# Patient Record
Sex: Female | Born: 1945 | Race: Black or African American | Hispanic: No | Marital: Married | Smoking: Never smoker
Health system: Southern US, Community
[De-identification: ages and names within clinical notes are randomized; demographics above are authoritative.]

## PROBLEM LIST (undated history)

## (undated) DIAGNOSIS — C50919 Malignant neoplasm of unspecified site of unspecified female breast: Secondary | ICD-10-CM

## (undated) DIAGNOSIS — I10 Essential (primary) hypertension: Secondary | ICD-10-CM

## (undated) DIAGNOSIS — C801 Malignant (primary) neoplasm, unspecified: Secondary | ICD-10-CM

## (undated) DIAGNOSIS — Z972 Presence of dental prosthetic device (complete) (partial): Secondary | ICD-10-CM

## (undated) DIAGNOSIS — E785 Hyperlipidemia, unspecified: Secondary | ICD-10-CM

## (undated) DIAGNOSIS — R011 Cardiac murmur, unspecified: Secondary | ICD-10-CM

## (undated) DIAGNOSIS — E039 Hypothyroidism, unspecified: Secondary | ICD-10-CM

## (undated) HISTORY — PX: COLONOSCOPY: SHX174

## (undated) HISTORY — PX: EYE SURGERY: SHX253

---

## 2004-10-16 ENCOUNTER — Ambulatory Visit: Payer: Self-pay | Admitting: Internal Medicine

## 2009-08-01 ENCOUNTER — Emergency Department: Payer: Self-pay | Admitting: Emergency Medicine

## 2009-09-21 ENCOUNTER — Ambulatory Visit: Payer: Self-pay | Admitting: Internal Medicine

## 2010-01-17 ENCOUNTER — Ambulatory Visit: Payer: Self-pay | Admitting: Internal Medicine

## 2011-02-20 ENCOUNTER — Ambulatory Visit: Payer: Self-pay | Admitting: Internal Medicine

## 2011-03-07 ENCOUNTER — Ambulatory Visit: Payer: Self-pay | Admitting: Internal Medicine

## 2011-12-31 DIAGNOSIS — Z923 Personal history of irradiation: Secondary | ICD-10-CM

## 2011-12-31 DIAGNOSIS — C50919 Malignant neoplasm of unspecified site of unspecified female breast: Secondary | ICD-10-CM

## 2011-12-31 HISTORY — DX: Malignant neoplasm of unspecified site of unspecified female breast: C50.919

## 2011-12-31 HISTORY — DX: Personal history of irradiation: Z92.3

## 2011-12-31 HISTORY — PX: BREAST EXCISIONAL BIOPSY: SUR124

## 2012-03-17 ENCOUNTER — Ambulatory Visit: Payer: Self-pay | Admitting: Internal Medicine

## 2012-03-20 ENCOUNTER — Ambulatory Visit: Payer: Self-pay | Admitting: Internal Medicine

## 2012-04-20 ENCOUNTER — Ambulatory Visit: Payer: Self-pay | Admitting: General Surgery

## 2012-04-20 HISTORY — PX: BREAST SURGERY: SHX581

## 2012-04-20 HISTORY — PX: BREAST LUMPECTOMY: SHX2

## 2012-04-23 ENCOUNTER — Ambulatory Visit: Payer: Self-pay | Admitting: General Surgery

## 2012-04-23 LAB — COMPREHENSIVE METABOLIC PANEL
Albumin: 3.5 g/dL (ref 3.4–5.0)
Anion Gap: 7 (ref 7–16)
Bilirubin,Total: 0.5 mg/dL (ref 0.2–1.0)
Calcium, Total: 8.6 mg/dL (ref 8.5–10.1)
Chloride: 109 mmol/L — ABNORMAL HIGH (ref 98–107)
Co2: 26 mmol/L (ref 21–32)
EGFR (African American): >60
EGFR (Non-African Amer.): >60
Glucose: 85 mg/dL (ref 65–99)
Osmolality: 282 (ref 275–301)
Potassium: 4.2 mmol/L (ref 3.5–5.1)
SGPT (ALT): 24 U/L
Total Protein: 6.8 g/dL (ref 6.4–8.2)

## 2012-04-23 LAB — CBC WITH DIFFERENTIAL/PLATELET
Eosinophil #: 0.1 10*3/uL (ref 0.0–0.7)
HGB: 13.4 g/dL (ref 12.0–16.0)
MCV: 91 fL (ref 80–100)
Monocyte #: 0.4 x10 3/mm (ref 0.2–0.9)
Neutrophil #: 3.6 10*3/uL (ref 1.4–6.5)
Neutrophil %: 59.8 %
Platelet: 179 10*3/uL (ref 150–440)
RBC: 4.37 10*6/uL (ref 3.80–5.20)
RDW: 13.8 % (ref 11.5–14.5)
WBC: 6 10*3/uL (ref 3.6–11.0)

## 2012-04-24 ENCOUNTER — Other Ambulatory Visit: Payer: Self-pay | Admitting: General Surgery

## 2012-04-24 DIAGNOSIS — C50919 Malignant neoplasm of unspecified site of unspecified female breast: Secondary | ICD-10-CM

## 2012-04-24 LAB — CEA: CEA: 0.9 ng/mL (ref 0.0–4.7)

## 2012-05-02 ENCOUNTER — Ambulatory Visit
Admission: RE | Admit: 2012-05-02 | Discharge: 2012-05-02 | Disposition: A | Discharge status: Home / Self Care | Payer: Managed Care, Other (non HMO) | Source: Ambulatory Visit | Attending: General Surgery | Admitting: General Surgery

## 2012-05-02 DIAGNOSIS — C50919 Malignant neoplasm of unspecified site of unspecified female breast: Secondary | ICD-10-CM

## 2012-05-02 MED ORDER — GADOBENATE DIMEGLUMINE 529 MG/ML IV SOLN
18.0000 mL | Freq: Once | INTRAVENOUS | Status: AC | PRN
  Administered 2012-05-02: 18 mL via INTRAVENOUS

## 2012-05-26 ENCOUNTER — Ambulatory Visit: Payer: Self-pay | Admitting: General Surgery

## 2012-06-09 ENCOUNTER — Ambulatory Visit: Payer: Self-pay | Admitting: Oncology

## 2012-06-29 ENCOUNTER — Ambulatory Visit: Payer: Self-pay | Admitting: Oncology

## 2012-07-09 LAB — CBC CANCER CENTER
Basophil #: 0 x10 3/mm (ref 0.0–0.1)
Basophil %: 0.6 %
Eosinophil #: 0.1 x10 3/mm (ref 0.0–0.7)
HGB: 13.7 g/dL (ref 12.0–16.0)
Lymphocyte %: 26 %
MCHC: 32.3 g/dL (ref 32.0–36.0)
Monocyte #: 0.3 x10 3/mm (ref 0.2–0.9)
Neutrophil %: 66 %
Platelet: 202 x10 3/mm (ref 150–440)
RBC: 4.6 10*6/uL (ref 3.80–5.20)

## 2012-07-16 LAB — CBC CANCER CENTER
Basophil #: 0 x10 3/mm (ref 0.0–0.1)
Basophil %: 0.6 %
Eosinophil #: 0.1 x10 3/mm (ref 0.0–0.7)
HCT: 40.7 % (ref 35.0–47.0)
HGB: 13.2 g/dL (ref 12.0–16.0)
Lymphocyte #: 1.2 x10 3/mm (ref 1.0–3.6)
MCH: 29.9 pg (ref 26.0–34.0)
MCHC: 32.5 g/dL (ref 32.0–36.0)
MCV: 92 fL (ref 80–100)
Monocyte #: 0.3 x10 3/mm (ref 0.2–0.9)
Neutrophil #: 3.6 x10 3/mm (ref 1.4–6.5)

## 2012-07-23 LAB — CBC CANCER CENTER
Basophil #: 0 x10 3/mm (ref 0.0–0.1)
Eosinophil %: 2.9 %
HCT: 40.8 % (ref 35.0–47.0)
Lymphocyte #: 1 x10 3/mm (ref 1.0–3.6)
MCH: 31 pg (ref 26.0–34.0)
MCV: 92 fL (ref 80–100)
Monocyte #: 0.4 x10 3/mm (ref 0.2–0.9)
Neutrophil #: 3.6 x10 3/mm (ref 1.4–6.5)
Platelet: 204 x10 3/mm (ref 150–440)
RDW: 13.5 % (ref 11.5–14.5)
WBC: 5.2 x10 3/mm (ref 3.6–11.0)

## 2012-07-30 ENCOUNTER — Ambulatory Visit: Payer: Self-pay | Admitting: Oncology

## 2012-07-30 LAB — CBC CANCER CENTER
Basophil %: 0.5 %
Eosinophil #: 0.1 x10 3/mm (ref 0.0–0.7)
Eosinophil %: 2.5 %
Lymphocyte #: 1 x10 3/mm (ref 1.0–3.6)
MCH: 30.9 pg (ref 26.0–34.0)
MCHC: 33.6 g/dL (ref 32.0–36.0)
MCV: 92 fL (ref 80–100)
Monocyte #: 0.5 x10 3/mm (ref 0.2–0.9)
Neutrophil %: 70.6 %
RBC: 4.53 10*6/uL (ref 3.80–5.20)

## 2012-08-06 LAB — CBC CANCER CENTER
Basophil #: 0 x10 3/mm (ref 0.0–0.1)
Eosinophil %: 2.9 %
HCT: 40.5 % (ref 35.0–47.0)
HGB: 13.7 g/dL (ref 12.0–16.0)
Lymphocyte #: 0.8 x10 3/mm — ABNORMAL LOW (ref 1.0–3.6)
MCH: 30.9 pg (ref 26.0–34.0)
MCV: 91 fL (ref 80–100)
Monocyte %: 7.8 %
Neutrophil #: 3.4 x10 3/mm (ref 1.4–6.5)
RBC: 4.43 10*6/uL (ref 3.80–5.20)
RDW: 13.8 % (ref 11.5–14.5)

## 2012-08-13 LAB — CBC CANCER CENTER
Basophil #: 0 x10 3/mm (ref 0.0–0.1)
Eosinophil #: 0.1 x10 3/mm (ref 0.0–0.7)
Eosinophil %: 2.5 %
Lymphocyte #: 0.7 x10 3/mm — ABNORMAL LOW (ref 1.0–3.6)
Lymphocyte %: 13.9 %
MCV: 91 fL (ref 80–100)
Monocyte %: 7.4 %
Neutrophil %: 75.7 %
Platelet: 170 x10 3/mm (ref 150–440)
RDW: 13.7 % (ref 11.5–14.5)
WBC: 5.2 x10 3/mm (ref 3.6–11.0)

## 2012-08-20 LAB — CBC CANCER CENTER
Basophil #: 0 x10 3/mm (ref 0.0–0.1)
Basophil %: 0.5 %
Eosinophil #: 0.1 x10 3/mm (ref 0.0–0.7)
HCT: 40.9 % (ref 35.0–47.0)
Lymphocyte #: 0.6 x10 3/mm — ABNORMAL LOW (ref 1.0–3.6)
Lymphocyte %: 13.3 %
MCH: 30.2 pg (ref 26.0–34.0)
MCV: 91 fL (ref 80–100)
Monocyte #: 0.4 x10 3/mm (ref 0.2–0.9)
Monocyte %: 8.1 %
RBC: 4.51 10*6/uL (ref 3.80–5.20)
WBC: 4.9 x10 3/mm (ref 3.6–11.0)

## 2012-08-30 ENCOUNTER — Ambulatory Visit: Payer: Self-pay | Admitting: Oncology

## 2012-09-29 ENCOUNTER — Ambulatory Visit: Payer: Self-pay | Admitting: Oncology

## 2012-12-08 ENCOUNTER — Ambulatory Visit: Payer: Self-pay | Admitting: Oncology

## 2012-12-30 ENCOUNTER — Ambulatory Visit: Payer: Self-pay | Admitting: Oncology

## 2013-01-30 ENCOUNTER — Ambulatory Visit: Payer: Self-pay | Admitting: Oncology

## 2013-02-27 ENCOUNTER — Ambulatory Visit: Payer: Self-pay | Admitting: Oncology

## 2013-03-30 ENCOUNTER — Ambulatory Visit: Payer: Self-pay | Admitting: Oncology

## 2013-05-30 ENCOUNTER — Ambulatory Visit: Payer: Self-pay | Admitting: Oncology

## 2013-06-29 ENCOUNTER — Ambulatory Visit: Payer: Self-pay | Admitting: Oncology

## 2013-07-30 ENCOUNTER — Ambulatory Visit: Payer: Self-pay | Admitting: Oncology

## 2013-08-30 ENCOUNTER — Ambulatory Visit: Payer: Self-pay | Admitting: Oncology

## 2013-09-29 ENCOUNTER — Ambulatory Visit: Payer: Self-pay | Admitting: Oncology

## 2014-01-04 ENCOUNTER — Ambulatory Visit: Payer: Self-pay | Admitting: Oncology

## 2014-01-30 ENCOUNTER — Ambulatory Visit: Payer: Self-pay | Admitting: Oncology

## 2014-03-22 ENCOUNTER — Ambulatory Visit: Payer: Self-pay | Admitting: Oncology

## 2014-05-05 ENCOUNTER — Ambulatory Visit: Payer: Self-pay | Admitting: Oncology

## 2014-05-30 ENCOUNTER — Ambulatory Visit: Payer: Self-pay | Admitting: Oncology

## 2014-08-16 ENCOUNTER — Ambulatory Visit: Payer: Self-pay | Admitting: Oncology

## 2014-08-30 ENCOUNTER — Ambulatory Visit: Payer: Self-pay | Admitting: Oncology

## 2014-11-17 ENCOUNTER — Ambulatory Visit: Payer: Self-pay | Admitting: Oncology

## 2014-11-29 ENCOUNTER — Ambulatory Visit: Payer: Self-pay | Admitting: Oncology

## 2015-03-24 ENCOUNTER — Ambulatory Visit: Payer: Self-pay | Admitting: Oncology

## 2015-04-23 NOTE — Op Note (Signed)
PATIENT NAME:  Cindy [name], Cindy [name] MR#:  [id number] DATE OF BIRTH:  June 13, 1946  DATE OF PROCEDURE:  04/20/2012  PREOPERATIVE DIAGNOSIS: Papillary neoplasm, right breast.   POSTOPERATIVE DIAGNOSIS: Papillary neoplasm, right breast.   OPERATIVE PROCEDURE: Wide local excision.   SURGEON: Robert Bellow, MD  ANESTHESIA: General by LMA, 0.5% Xylocaine with 0.25% Marcaine with NaH2CO3 30 mL local infiltration.   ESTIMATED BLOOD LOSS: Less than 10 mL.   CLINICAL NOTE: This 69 year old woman was found to have a complex cystic lesion in the 12:00 position of the right breast. Vacuum-assisted biopsy showed evidence of a papillary neoplasm, excision was planned to help establish if there was a malignant potential.   OPERATIVE NOTE: The patient underwent general anesthesia and tolerated this well. The breast was prepped with ChloraPrep and draped. Ultrasound was used to confirm the area of the old biopsy. This was infiltrated with the above-mentioned local anesthetic for postoperative analgesia. A skin line and curvilinear incision in the upper aspect of the breast approximately 6 cm from the nipple was carried down through the skin and subcutaneous tissue. Beginning about 1.5 cm below the skin the biopsy cavity was created about 4 cm in diameter and depth. The specimen was orientated and specimen radiograph confirmed the previously placed clip was in the medial aspect of the specimen. Hemostasis was with electrocautery. The deep tissue was approximated with interrupted 2-0 Vicryl figure-of-eight sutures. The skin was closed with running 4-0 Vicryl subcuticular suture. Benzoin, Steri-Strips followed by Telfa and Tegaderm dressing was applied. The patient's bra was then placed to provide adequate support.        The patient tolerated the procedure well and was taken to recovery room in stable condition.   ____________________________ Robert Bellow, MD jwb:cms D: 04/20/2012 20:51:39  ET T: 04/21/2012 09:39:58 ET JOB#: [id number]  cc: Robert Bellow, MD, <Dictator> Perrin Maltese, MD Gildo Crisco Amedeo Kinsman MD ELECTRONICALLY SIGNED 04/21/2012 14:59

## 2015-04-23 NOTE — Consult Note (Signed)
Reason for Visit: This 69 year old Female patient presents to the clinic for initial evaluation of  Breast cancer .   Referred by Dr. Hervey Ard.  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 Fine-needle aspiration was performed showing atypical papillary neoplasm. She went on to have a wide local excision showing a 1.1 cm invasive pleomorphic lobular carcinoma. Ductal carcinoma was present. Tumor was overall nuclear at a rate of 2. Angiolymphatic invasion was not identified. Margins were uninvolved although close at less than 0.1 mm. DCIS margin was also close at 2 mm. Tumor was strongly ER positive PR negative not overexpressed by HER-2/neu. Because of this was a lobular carcinoma breast MRI was ordered showed no evidence of synchronous lesions.she underwent reexcision which was negative for any evidence of invasive or in situ carcinoma. No sentinel lymph node was identified at the time of resection. She is done well postoperatively. She is seen today for consideration of medical oncology and radiation oncology opinion.  Past Hx:    Breast Cancer: Right, 2013   Anemia:    Right breast mass:    Gallstones:    Hypercholesterolemia:    Excision of right breast mass: Apr 2013   Colonoscopy: 2004  Past, Family and Social History:   Past Medical History positive    Cardiovascular hyperlipidemia; hypertension    Gastrointestinal gall stones    Past Medical History Comments Anemia    Family  History positive    Family History Comments Family history positive for prostate cancer adult onset diabetes and hypertension as well as breast cancer.    Social History noncontributory    Additional Past Medical and Surgical History Seen accompanied by nurse navigator today   Allergies:   No Known Allergies:   Home Meds:  Home Medications: Medication Instructions Status  Crestor 20 mg oral tablet 1 tab(s) orally once a day (in the morning) Active  Claritin 10 mg oral tablet 1 tab(s) orally once a day, As Needed Active   Review of Systems:   General negative    Performance Status (ECOG) 0    Skin negative    Breast see HPI    Ophthalmologic negative    ENMT negative    Respiratory and Thorax negative    Cardiovascular negative    Gastrointestinal negative    Genitourinary negative    Musculoskeletal negative    Neurological negative    Psychiatric negative    Hematology/Lymphatics negative    Endocrine negative    Allergic/Immunologic negative   Nursing Notes:  Nursing Vital Signs and Chemo Nursing Nursing Notes: *CC Vital Signs Flowsheet:   11-Jun-13 10:02   Temp Temperature 96.7   Respirations Respirations 16   SBP SBP 124   DBP DBP 79   Pain Scale (0-10)  0   Current Weight (kg) (kg) 86.6    10:44   Temp Temperature 96.7   Pulse Pulse 73   Respirations Respirations 16   SBP SBP 124   DBP DBP 79   Pain Scale (  0-10)  0   Current Weight (kg) (kg) 86.6   Height (cm) centimeters 190.9   BSA (m2) 2.1   Physical Exam:  General/Skin/HEENT:   General normal    Skin normal    Eyes normal    ENMT normal    Head and Neck normal    Additional PE Well-developed slightly obese female in NAD. Right breast is a wide local excision scar which is healed well. She also is an axillary incision which is also well-healed. No dominant mass or nodularity is noted in either breast into position examined. Lungs are clear to A&P cardiac examination shows  regular rate and rhythm. Abdomen is benign. No axillary or supraclavicular adenopathy is identified.   Breasts/Resp/CV/GI/GU:   Respiratory and Thorax normal    Cardiovascular normal    Gastrointestinal normal    Genitourinary normal   MS/Neuro/Psych/Lymph:   Musculoskeletal normal    Neurological normal    Lymphatics normal   Assessment and Plan:  Impression:   pathologic stage I invasive lobular carcinoma the right breast has post wide local excision and attempted sentinel node lymph node biopsy in 69 year old female  Plan:   this time I discussed the case with Dr. Grayland Ormond. He will be ordering Oncotype DX testing to determine need for chemotherapy. I have recommended whole breast radiation up to 5000 cGy. Also boost or scar another 1400 cGy. 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 Risks and benefits of treatment were reviewed with the patient including skin reaction, fatigue and incorporation of small portion of superficial lung were all explained in detail to the patient. I have given her a CT simulation appointment for about 2 weeks and hopefully the Oncotype DX result will be back by that time. Patient also be consulted by Dr. Grayland Ormond.I did discuss the case with Dr. Hervey Ard and based on our discussion she is not a candidate for accelerated partial breast irradiation.  I would like to take this opportunity to thank you for allowing me to continue to participate in this patient's care.  CC Referral:   cc: Dr. Lamonte Sakai, Dr. Hervey Ard   Electronic Signatures: Armstead Peaks (MD)  (Signed 11-Jun-13 11:28)  Authored: HPI, Diagnosis, Past Hx, PFSH, Allergies, Home Meds, ROS, Nursing Notes, Physical Exam, Encounter Assessment and  Plan, CC Referring Physician   Last Updated: 11-Jun-13 11:28 by Armstead Peaks (MD)

## 2015-04-23 NOTE — Op Note (Signed)
PATIENT NAME:  Cindy [name], Cindy [name] MR#:  [id number] DATE OF BIRTH:  Dec 12, 1946  DATE OF PROCEDURE:  05/26/2012  PREOPERATIVE DIAGNOSIS: Infiltrating lobular carcinoma, right breast.   POSTOPERATIVE DIAGNOSIS: Infiltrating lobular carcinoma, right breast.  OPERATIVE PROCEDURE: Wide local excision, right breast, attempted axillary sentinel node biopsy.   SURGEON: Hervey Ard, MD   ANESTHESIA: General by LMA under Dr. Ronelle Nigh.   ANESTHESIOLOGIST: Dennard Nip, MD   ESTIMATED BLOOD LOSS: 25 mL.   CLINICAL NOTE: This 69 year old woman underwent a stereotactic biopsy with findings of atypical papilloma. She subsequently underwent an ultrasound-guided wide excision of this area with findings of a small infiltrating lobular carcinoma. Margins were close but clear. Preoperative breast MRI showed no secondary lesions or evidence of axillary adenopathy. She was felt to be a candidate for re-excision of the site and sentinel node biopsy. She was injected with technetium sulfur colloid prior to the procedure.   OPERATIVE NOTE: With the patient under adequate general anesthesia, 4 mL of methylene blue diluted 1:2 with normal saline was injected in the subareolar plexus. The breast and axilla was then prepped with ChloraPrep and draped. Attention was turned to the axilla. Exceptionally low counts were obtained with the gamma finder (less than 12). A small incision was made at the inferior aspect of the axillary envelope and extended to the area at the latissimus muscle. The skin was incised sharply and hemostasis achieved with electrocautery. A total of 20 mL of 0.5% Marcaine with 1:200,000 units of epinephrine was used for both this excision and the wide excision site during the procedure.   Hemostasis was with electrocautery. The incision was extended down to the pectoralis and serratus fascia. Palpation showed no discernible lesion. Scanning with the gamma finder and ultrasound showed no discernible  lesions. No lymphatics were identified with methylene blue. Considering the negative breast MRI, it was elected to not complete an axillary dissection. The wound was closed in layers with 2-0 Vicryl figure-of-eight sutures for the deep tissue and a running 4-0 Vicryl subcuticular suture for the skin.   Attention was turned to the breast. Ultrasound was used to confirm the old biopsy cavity. The pathologist had reported the medial and superior margins to be close. The old incision was opened and the cavity identified. An additional 1.5 cm of tissue medial and superior to the original site was excised. The specimen was orientated with four Marcaine clips and sent in formalin for histology. The breast tissue was elevated off the underlying pectoralis muscle and approximated with interrupted 2-0 Vicryl figure-of-eight sutures. The more superficial layers were treated in a similar fashion. The skin was closed with a running 4-0 Vicryl subcuticular suture. Benzoin and Steri-Strips were applied. A compressive wrap followed by a fluff, Kerlix and an Ace wrap was then applied.   The patient tolerated the procedure well and was taken to the recovery room in stable condition.  ____________________________ Robert Bellow, MD jwb:cbb D: 05/26/2012 14:26:00 ET T: 05/26/2012 15:50:30 ET JOB#: [id number]  cc: Robert Bellow, MD, <Dictator> Perrin Maltese, MD JEFFREY Amedeo Kinsman MD ELECTRONICALLY SIGNED 05/26/2012 17:34

## 2015-05-18 ENCOUNTER — Inpatient Hospital Stay: Payer: Medicare Other | Attending: Oncology | Admitting: Oncology

## 2015-06-27 ENCOUNTER — Other Ambulatory Visit: Payer: Self-pay | Admitting: Internal Medicine

## 2015-06-27 DIAGNOSIS — Z853 Personal history of malignant neoplasm of breast: Secondary | ICD-10-CM

## 2015-06-28 ENCOUNTER — Other Ambulatory Visit (HOSPITAL_COMMUNITY): Payer: Self-pay | Admitting: Internal Medicine

## 2015-06-28 ENCOUNTER — Other Ambulatory Visit: Payer: Self-pay | Admitting: Internal Medicine

## 2015-06-28 DIAGNOSIS — M25562 Pain in left knee: Secondary | ICD-10-CM

## 2015-06-30 ENCOUNTER — Ambulatory Visit: Payer: Managed Care, Other (non HMO)

## 2015-07-06 ENCOUNTER — Encounter
Admission: RE | Admit: 2015-07-06 | Discharge: 2015-07-06 | Disposition: A | Discharge status: Home / Self Care | Payer: Medicare Other | Source: Ambulatory Visit | Attending: Internal Medicine | Admitting: Internal Medicine

## 2015-07-06 DIAGNOSIS — M546 Pain in thoracic spine: Secondary | ICD-10-CM | POA: Diagnosis present

## 2015-07-06 DIAGNOSIS — Z853 Personal history of malignant neoplasm of breast: Secondary | ICD-10-CM | POA: Diagnosis not present

## 2015-07-06 DIAGNOSIS — M25562 Pain in left knee: Secondary | ICD-10-CM | POA: Insufficient documentation

## 2015-07-06 HISTORY — DX: Malignant (primary) neoplasm, unspecified: C80.1

## 2015-07-06 HISTORY — DX: Essential (primary) hypertension: I10

## 2015-07-06 MED ORDER — TECHNETIUM TC 99M MEDRONATE IV KIT
22.2750 | PACK | Freq: Once | INTRAVENOUS | Status: AC | PRN
Start: 2015-07-06 — End: 2015-07-06
  Administered 2015-07-06: 22.275 via INTRAVENOUS

## 2015-07-20 ENCOUNTER — Inpatient Hospital Stay: Payer: Medicare Other | Attending: Oncology | Admitting: Oncology

## 2015-07-20 VITALS — BP 133/73 | HR 85 | Temp 95.9°F | Resp 18 | Wt 200.2 lb

## 2015-07-20 DIAGNOSIS — Z780 Asymptomatic menopausal state: Secondary | ICD-10-CM

## 2015-07-20 DIAGNOSIS — Z79811 Long term (current) use of aromatase inhibitors: Secondary | ICD-10-CM | POA: Diagnosis not present

## 2015-07-20 DIAGNOSIS — Z170 Estrogen receptor positive status [ER+]: Secondary | ICD-10-CM | POA: Insufficient documentation

## 2015-07-20 DIAGNOSIS — C50911 Malignant neoplasm of unspecified site of right female breast: Secondary | ICD-10-CM

## 2015-07-20 DIAGNOSIS — C50919 Malignant neoplasm of unspecified site of unspecified female breast: Secondary | ICD-10-CM

## 2015-07-25 ENCOUNTER — Ambulatory Visit: Payer: Medicare Other

## 2015-07-27 ENCOUNTER — Ambulatory Visit
Admission: RE | Admit: 2015-07-27 | Discharge: 2015-07-27 | Disposition: A | Discharge status: Home / Self Care | Payer: Medicare Other | Source: Ambulatory Visit | Attending: Oncology | Admitting: Oncology

## 2015-07-27 DIAGNOSIS — Z780 Asymptomatic menopausal state: Secondary | ICD-10-CM | POA: Insufficient documentation

## 2015-07-30 DIAGNOSIS — C50411 Malignant neoplasm of upper-outer quadrant of right female breast: Secondary | ICD-10-CM | POA: Insufficient documentation

## 2015-07-30 NOTE — Progress Notes (Signed)
[facility]  [phone] [phone]  ID: Cindy [name] OB: 05/22/1946  MR#: [id number]  EYC#:[id number]  Patient Care Team: Perrin Maltese, MD as PCP - General (Internal Medicine)  CHIEF COMPLAINT:  Chief Complaint  Patient presents with  . Follow-up    breast cancer    INTERVAL HISTORY: Patient returns to clinic today for further evaluation and routine 6 month followup.  She is tolerating letrozole well without significant side effects.  She currently feels well and is asymptomatic.  She has no recent fevers or illnesses.  She has a good appetite and denies weight loss.  She has no neurologic complaints.  She denies any other pain.  She has no nausea, vomiting, constipation, or diarrhea.  She has no urinary complaints.  Patient offers no specific complaints today.  REVIEW OF SYSTEMS:   Review of Systems  Constitutional: Negative.   Musculoskeletal: Negative.   Neurological: Negative.     As per HPI. Otherwise, a complete review of systems is negatve.  PAST MEDICAL HISTORY: Past Medical History  Diagnosis Date  . Hypertension   . Cancer     breast    PAST SURGICAL HISTORY: Past Surgical History  Procedure Laterality Date  . Breast surgery Right 2013    lumpectomy    FAMILY HISTORY No family history on file.     ADVANCED DIRECTIVES:    HEALTH MAINTENANCE: History  Substance Use Topics  . Smoking status: Not on file  . Smokeless tobacco: Not on file  . Alcohol Use: Not on file     Colonoscopy:  PAP:  Bone density:  Lipid panel:  No Known Allergies  Current Outpatient Prescriptions  Medication Sig Dispense Refill  . amLODipine (NORVASC) 5 MG tablet daily.  6  . atorvastatin (LIPITOR) 20 MG tablet     . hydrochlorothiazide (MICROZIDE) 12.5 MG capsule Take 12.5 mg by mouth 2 (two) times daily.  6  . letrozole (FEMARA) 2.5 MG tablet Take 2.5 mg by mouth daily.  5   No current facility-administered medications for  this visit.    OBJECTIVE: Filed Vitals:   07/20/15 1040  BP: 133/73  Pulse: 85  Temp: 95.9 F (35.5 C)  Resp: 18     There is no height on file to calculate BMI.    ECOG FS:0 - Asymptomatic  General: Well-developed, well-nourished, no acute distress. Eyes: Pink conjunctiva, anicteric sclera. Breasts: Bilateral breast and axilla without lumps or masses. Lungs: Clear to auscultation bilaterally. Heart: Regular rate and rhythm. No rubs, murmurs, or gallops. Abdomen: Soft, nontender, nondistended. No organomegaly noted, normoactive bowel sounds. Musculoskeletal: No edema, cyanosis, or clubbing. Neuro: Alert, answering all questions appropriately. Cranial nerves grossly intact. Skin: No rashes or petechiae noted. Psych: Normal affect.  LAB RESULTS:  Lab Results  Component Value Date   NA 142 04/23/2012   K 4.2 04/23/2012   CL 109* 04/23/2012   CO2 26 04/23/2012   GLUCOSE 85 04/23/2012   BUN 11 04/23/2012   CREATININE 0.78 04/23/2012   CALCIUM 8.6 04/23/2012   PROT 6.8 04/23/2012   ALBUMIN 3.5 04/23/2012   AST 21 04/23/2012   ALT 24 04/23/2012   ALKPHOS 88 04/23/2012   BILITOT 0.5 04/23/2012   GFRNONAA >60 04/23/2012   GFRAA >60 04/23/2012    Lab Results  Component Value Date   WBC 4.9 08/20/2012   NEUTROABS 3.7 08/20/2012   HGB 13.6 08/20/2012   HCT 40.9 08/20/2012   MCV 91 08/20/2012   PLT  175 08/20/2012     STUDIES: Nm Bone Scan Whole Body  07/06/2015   CLINICAL DATA:  Breast carcinoma.  Back and left knee pain.  EXAM: NUCLEAR MEDICINE WHOLE BODY BONE SCAN  TECHNIQUE: Whole body anterior and posterior images were obtained approximately 3 hours after intravenous injection of radiopharmaceutical.  RADIOPHARMACEUTICALS:  22.3 mCi Technetium-74m MDP IV  COMPARISON:  None.  FINDINGS: Degenerative uptake is seen in the medial compartment of the left knee. Degenerative uptake also seen in both feet and AC joints.  No other abnormal sites of radiopharmaceutical uptake  are seen within the axial or appendicular skeleton.  IMPRESSION: No evidence of osseous metastatic disease.  Degenerative uptake in medial compartment of left knee, bilateral feet, and bilateral AC joints.   Electronically Signed   By: Earle Gell M.D.   On: 07/06/2015 12:49   Dg Bone Density  07/27/2015   EXAM: DUAL X-RAY ABSORPTIOMETRY (DXA) FOR BONE MINERAL DENSITY  IMPRESSION: Dear Dr. Grayland Ormond, Your patient Cindy [name] completed a BMD test on 07/27/2015 using the [facility] (analysis version: 14.10) manufactured by [employer]. The following summarizes the results of our evaluation. PATIENT BIOGRAPHICAL: Name: Cindy, [name] Patient ID: [id number] Birth Date: 27-Jun-1946 Height: 63.0 in. Gender: Female Exam Date: 07/27/2015 Weight: 201.0 lbs. Indications: Breast CA, High Risk Meds, History of Breast Cancer, Postmenopausal Fractures: Treatments: letrozole, lipitor  ASSESSMENT: The BMD measured at Femur Neck Left is 0.902 g/cm2 with a T-score of -1.0. This patient is considered normal according to [facility]) criteria. Site Region Measured Measured WHO Young Adult BMD Date       Age      Classification T-score AP Spine L1-L4 07/27/2015 66.7 Normal 0.2 1.219 g/cm2 AP Spine L1-L4 01/18/2014 65.2 Normal 0.6 1.263 g/cm2 AP Spine L1-L4 09/08/2012 63.9 Normal 0.9 1.301 g/cm2  DualFemur Neck Left 07/27/2015 66.7 Normal -1.0 0.902 g/cm2 DualFemur Neck Left 01/18/2014 65.2 Normal -0.6 0.961 g/cm2 DualFemur Neck Left 09/08/2012 63.9 Normal 0.0 1.033 g/cm2  World Health Organization [facility]) criteria for post-menopausal, Caucasian Women: Normal:       T-score at or above -1 SD Osteopenia:   T-score between -1 and -2.5 SD Osteoporosis: T-score at or below -2.5 SD  RECOMMENDATIONS: [place] recommends that FDA-approved medical therapies be considered in postmenopausal women and men age 44 or older with a: 1. Hip or vertebral (clinical or morphometric) fracture. 2.  T-score of < -2.5 at the spine or hip. 3. Ten-year fracture probability by FRAX of 3% or greater for hip fracture or 20% or greater for major osteoporotic fracture.  All treatment decisions require clinical judgment and consideration of individual patient factors, including patient preferences, co-morbidities, previous drug use, risk factors not captured in the FRAX model (e.g. falls, vitamin D deficiency, increased bone turnover, interval significant decline in bone density) and possible under - or over-estimation of fracture risk by FRAX.  All patients should ensure an adequate intake of dietary calcium (1200 mg/d) and vitamin D (800 IU daily) unless contraindicated. FOLLOW-UP: People with diagnosed cases of osteoporosis or at high risk for fracture should have regular bone mineral density tests. For patients eligible for Medicare, routine testing is allowed once every 2 years. The testing frequency can be increased to one year for patients who have rapidly progressing disease, those who are receiving or discontinuing medical therapy to restore bone mass, or have additional risk factors.  I have reviewed this report, and agree with the above findings.  [facility]   Electronically Signed   By: Julian Hy M.D.   On: 07/27/2015 14:49    ASSESSMENT: Stage Ia ER positive breast cancer, Oncotype DX score 25 which is intermediate cancer recurrence.  PLAN:    1.  Breast cancer: Previously, Oncotype DX was 25 which is considered intermediate risk and has an approximate 16% chance of distant recurrence. After a lengthy discussion with the patient, she declined adjuvant chemotherapy and wished to proceed with adjuvant XRT followed by an aromatase inhibitor for 5 years. Continue letrozle completing in September 2018.  Patient's most recent mammogram on March 24, 2015 was reported as BI-RADS 2. Repeat in one year. Return to clinic in 6 months for routine evaluation. 2. Postmenopausal: Bone mineral  density completed on July 27, 2015 had a reported T score of -1.0. This is considered normal. Repeat in July 2018.     Patient expressed understanding and was in agreement with this plan. She also understands that She can call clinic at any time with any questions, concerns, or complaints.   Breast cancer   Staging form: Breast, AJCC 7th Edition     Clinical stage from 07/30/2015: Stage IA (T1c, N0, M0) - Signed by Lloyd Huger, MD on 07/30/2015   Lloyd Huger, MD   07/30/2015 12:52 PM

## 2015-08-17 ENCOUNTER — Ambulatory Visit
Admission: RE | Admit: 2015-08-17 | Discharge: 2015-08-17 | Disposition: A | Discharge status: Home / Self Care | Payer: Medicare Other | Source: Ambulatory Visit | Attending: Radiation Oncology | Admitting: Radiation Oncology

## 2015-08-17 ENCOUNTER — Encounter: Payer: Self-pay | Admitting: Radiation Oncology

## 2015-08-17 VITALS — BP 138/84 | HR 87 | Temp 97.1°F | Resp 20 | Ht 63.0 in | Wt 200.0 lb

## 2015-08-17 DIAGNOSIS — C50911 Malignant neoplasm of unspecified site of right female breast: Secondary | ICD-10-CM

## 2015-08-17 NOTE — Progress Notes (Signed)
Radiation Oncology Follow up Note  Name: Cindy [name]   Date:   08/17/2015 MRN:  [id number] DOB: [birth year]    This 69 y.o. female presents to the clinic today for follow-up for stage I breast cancer lobular carcinoma of the right breast status post whole breast radiation ER positive PR negative HER-2/neu not overexpressed.Marland Kitchen  REFERRING PROVIDER: Perrin Maltese, MD  HPI: Patient is a 69 year old female now out 3 years having completed whole breast radiation to her right breast for stage I infiltrating lobular carcinoma. Tumor was ER/PR positive HER-2/neu not overexpressed. She's currently on letrozole tolerating that well. She's been having some hot flashes prescription for Effexor was made although she's not yet filled that. She specifically denies breast tenderness cough or bone pain..  COMPLICATIONS OF TREATMENT: none  FOLLOW UP COMPLIANCE: keeps appointments   PHYSICAL EXAM:  BP 138/84 mmHg  Pulse 87  Temp(Src) 97.1 F (36.2 C)  Resp 20  Ht 5' 3" (1.6 m)  Wt 199 lb 15.3 oz (90.7 kg)  BMI 35.43 kg/m2 Lungs are clear to A&P cardiac examination essentially unremarkable with regular rate and rhythm. No dominant mass or nodularity is noted in either breast in 2 positions examined. Incision is well-healed. No axillary or supraclavicular adenopathy is appreciated. Cosmetic result is excellent. Well-developed well-nourished patient in NAD. HEENT reveals PERLA, EOMI, discs not visualized.  Oral cavity is clear. No oral mucosal lesions are identified. Neck is clear without evidence of cervical or supraclavicular adenopathy. Lungs are clear to A&P. Cardiac examination is essentially unremarkable with regular rate and rhythm without murmur rub or thrill. Abdomen is benign with no organomegaly or masses noted. Motor sensory and DTR levels are equal and symmetric in the upper and lower extremities. Cranial nerves II through XII are grossly intact. Proprioception is intact. No peripheral adenopathy  or edema is identified. No motor or sensory levels are noted. Crude visual fields are within normal range.   RADIOLOGY RESULTS: Mammograms are reviewed  PLAN: At the present time she continues to do well with no evidence of disease. I have asked to see her back in 1 year for follow-up. She continues on letrozole. We made sure she gets her prescription for Effexor for her hot flashes. Patient knows to call with any concerns. She continues close follow-up care with surgeon who is been ordering her mammograms as well as medical oncology.  I would like to take this opportunity for allowing me to participate in the care of your patient.Armstead Peaks., MD

## 2015-08-31 ENCOUNTER — Telehealth: Payer: Self-pay | Admitting: *Deleted

## 2015-08-31 MED ORDER — LETROZOLE 2.5 MG PO TABS: 2.5000 mg | ORAL_TABLET | Freq: Every day | ORAL | Status: DC

## 2015-08-31 NOTE — Telephone Encounter (Signed)
Escribed

## 2016-01-22 ENCOUNTER — Ambulatory Visit: Payer: Medicare Other | Admitting: Oncology

## 2016-01-23 ENCOUNTER — Inpatient Hospital Stay: Payer: Medicare Other | Attending: Oncology | Admitting: Oncology

## 2016-01-23 VITALS — BP 127/84 | HR 87 | Temp 97.2°F | Resp 18 | Wt 199.7 lb

## 2016-01-23 DIAGNOSIS — Z79899 Other long term (current) drug therapy: Secondary | ICD-10-CM | POA: Diagnosis not present

## 2016-01-23 DIAGNOSIS — Z170 Estrogen receptor positive status [ER+]: Secondary | ICD-10-CM | POA: Diagnosis not present

## 2016-01-23 DIAGNOSIS — M2550 Pain in unspecified joint: Secondary | ICD-10-CM | POA: Insufficient documentation

## 2016-01-23 DIAGNOSIS — Z780 Asymptomatic menopausal state: Secondary | ICD-10-CM | POA: Diagnosis not present

## 2016-01-23 DIAGNOSIS — I10 Essential (primary) hypertension: Secondary | ICD-10-CM | POA: Insufficient documentation

## 2016-01-23 DIAGNOSIS — C50911 Malignant neoplasm of unspecified site of right female breast: Secondary | ICD-10-CM | POA: Diagnosis not present

## 2016-01-23 DIAGNOSIS — Z79811 Long term (current) use of aromatase inhibitors: Secondary | ICD-10-CM | POA: Insufficient documentation

## 2016-01-23 NOTE — Progress Notes (Signed)
Patient has worsening body aches for the past month.

## 2016-01-23 NOTE — Progress Notes (Signed)
[place]  Telephone:(336) [phone] Fax:(336) [phone]  ID: Cindy [name] [name] OB: December 02, 1946  MR#: [id number]  [id number]  Patient Care Team: Cindy [name] Maltese, MD as PCP - General (Internal Medicine)  CHIEF COMPLAINT:  Chief Complaint  Patient presents with  . Breast Cancer    INTERVAL HISTORY: Patient returns to clinic today for further evaluation and routine 6 month followup.  She is tolerating letrozole well without significant side effects.  She currently feels well and is asymptomatic.  She does complain of joint pain that she has had in the past but has worsened in the past 6 weeks. She has no recent fevers or illnesses.  She has a good appetite and denies weight loss.  She has no neurologic complaints. She has no nausea, vomiting, constipation, or diarrhea.  She has no urinary complaints.  Patient offers no specific complaints today.  REVIEW OF SYSTEMS:   Review of Systems  Constitutional: Negative.   Cardiovascular: Negative.   Gastrointestinal: Negative.   Musculoskeletal: Positive for joint pain.  Neurological: Negative.     As per HPI. Otherwise, a complete review of systems is negatve.  PAST MEDICAL HISTORY: Past Medical History  Diagnosis Date  . Hypertension   . Cancer     breast    PAST SURGICAL HISTORY: Past Surgical History  Procedure Laterality Date  . Breast surgery Right 2013    lumpectomy    FAMILY HISTORY No family history on file.     ADVANCED DIRECTIVES:    HEALTH MAINTENANCE: Social History  Substance Use Topics  . Smoking status: Never Smoker   . Smokeless tobacco: Never Used  . Alcohol Use: Not on file     Colonoscopy:  PAP:  Bone density:  Lipid panel:  No Known Allergies  Current Outpatient Prescriptions  Medication Sig Dispense Refill  . amLODipine (NORVASC) 5 MG tablet daily.  6  . atorvastatin (LIPITOR) 20 MG tablet     . Coenzyme Q10 (CO Q 10 PO) Take 1 tablet by mouth daily.    .  hydrochlorothiazide (MICROZIDE) 12.5 MG capsule Take 12.5 mg by mouth 2 (two) times daily.  6  . letrozole (FEMARA) 2.5 MG tablet Take 1 tablet (2.5 mg total) by mouth daily. 90 tablet 1   No current facility-administered medications for this visit.    OBJECTIVE: Filed Vitals:   01/23/16 1032  BP: 127/84  Pulse: 87  Temp: 97.2 F (36.2 C)  Resp: 18     Body mass index is 35.39 kg/(m^2).    ECOG FS:0 - Asymptomatic  General: Well-developed, well-nourished, no acute distress. Eyes: Pink conjunctiva, anicteric sclera. Breasts: Bilateral breast and axilla without lumps or masses. Lungs: Clear to auscultation bilaterally. Heart: Regular rate and rhythm. No rubs, murmurs, or gallops. Abdomen: Soft, nontender, nondistended. No organomegaly noted, normoactive bowel sounds. Musculoskeletal: No edema, cyanosis, or clubbing. Neuro: Alert, answering all questions appropriately. Cranial nerves grossly intact. Skin: No rashes or petechiae noted. Psych: Normal affect.  LAB RESULTS:  Lab Results  Component Value Date   NA 142 04/23/2012   K 4.2 04/23/2012   CL 109* 04/23/2012   CO2 26 04/23/2012   GLUCOSE 85 04/23/2012   BUN 11 04/23/2012   CREATININE 0.78 04/23/2012   CALCIUM 8.6 04/23/2012   PROT 6.8 04/23/2012   ALBUMIN 3.5 04/23/2012   AST 21 04/23/2012   ALT 24 04/23/2012   ALKPHOS 88 04/23/2012   BILITOT 0.5 04/23/2012   GFRNONAA >60 04/23/2012   GFRAA >60 04/23/2012  Lab Results  Component Value Date   WBC 4.9 08/20/2012   NEUTROABS 3.7 08/20/2012   HGB 13.6 08/20/2012   HCT 40.9 08/20/2012   MCV 91 08/20/2012   PLT 175 08/20/2012     STUDIES: No results found.  ASSESSMENT: Stage Ia ER positive breast cancer, Oncotype DX score 25 which is intermediate cancer recurrence.  PLAN:    1.  Breast cancer: Previously, Oncotype DX was 25 which is considered intermediate risk and has an approximate 16% chance of distant recurrence. After a lengthy discussion with the  patient, she declined adjuvant chemotherapy and wished to proceed with adjuvant XRT followed by an aromatase inhibitor for 5 years. Continue letrozle completing in September 2018.  Patient's most recent mammogram on March 24, 2015 was reported as BI-RADS 2. Mammogram ordered for March 2017. Return to clinic in 6 months for routine evaluation. 2. Postmenopausal: Bone mineral density completed on July 27, 2015 had a reported T score of -1.0. This is considered normal. Repeat in July 2018.   3. Joint Pain: Patient will stop the letrozole for 4 weeks to see if joint pain subsides. She will call the office in 4 weeks to decide whether she will restart letrozole or change medication.   Patient expressed understanding and was in agreement with this plan. She also understands that She can call clinic at any time with any questions, concerns, or complaints.   Breast cancer   Staging form: Breast, AJCC 7th Edition     Clinical stage from 07/30/2015: Stage IA (T1c, N0, M0) - Signed by Cindy [name] Huger, MD on 07/30/2015  Dr. Grayland [name] was available for consultation and review of plan of care for this patient.  Cindy [name] Reel, NP   01/23/2016 2:13 PM  Patient was seen and evaluated independently and I agree with the assessment and plan as dictated above.  Cindy [name] Huger, MD 01/24/2016 1:51 PM

## 2016-03-25 ENCOUNTER — Other Ambulatory Visit: Payer: Self-pay | Admitting: Oncology

## 2016-03-25 ENCOUNTER — Ambulatory Visit
Admission: RE | Admit: 2016-03-25 | Discharge: 2016-03-25 | Disposition: A | Discharge status: Home / Self Care | Payer: Medicare Other | Source: Ambulatory Visit | Attending: Oncology | Admitting: Oncology

## 2016-03-25 ENCOUNTER — Other Ambulatory Visit: Payer: Self-pay | Admitting: *Deleted

## 2016-03-25 DIAGNOSIS — C50911 Malignant neoplasm of unspecified site of right female breast: Secondary | ICD-10-CM

## 2016-03-25 DIAGNOSIS — Z853 Personal history of malignant neoplasm of breast: Secondary | ICD-10-CM | POA: Insufficient documentation

## 2016-03-25 DIAGNOSIS — Z9889 Other specified postprocedural states: Secondary | ICD-10-CM | POA: Insufficient documentation

## 2016-03-25 HISTORY — DX: Malignant neoplasm of unspecified site of unspecified female breast: C50.919

## 2016-03-25 MED ORDER — LETROZOLE 2.5 MG PO TABS: 2.5000 mg | ORAL_TABLET | Freq: Every day | ORAL | Status: DC

## 2016-07-23 ENCOUNTER — Inpatient Hospital Stay: Payer: Medicare Other | Attending: Oncology | Admitting: Oncology

## 2016-07-23 VITALS — BP 129/81 | HR 81 | Temp 97.6°F | Wt 202.4 lb

## 2016-07-23 DIAGNOSIS — Z923 Personal history of irradiation: Secondary | ICD-10-CM | POA: Insufficient documentation

## 2016-07-23 DIAGNOSIS — Z170 Estrogen receptor positive status [ER+]: Secondary | ICD-10-CM | POA: Insufficient documentation

## 2016-07-23 DIAGNOSIS — Z79811 Long term (current) use of aromatase inhibitors: Secondary | ICD-10-CM | POA: Diagnosis not present

## 2016-07-23 DIAGNOSIS — C50411 Malignant neoplasm of upper-outer quadrant of right female breast: Secondary | ICD-10-CM | POA: Insufficient documentation

## 2016-07-25 NOTE — Progress Notes (Signed)
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  She is tolerating letrozole well without significant side effects. She continues to complain of mild joint pain, but this is chronic and unchanged. She otherwise feels well and is asymptomatic. She has no recent fevers or illnesses. She denies any chest pain or shortness of breath. She has a good appetite and denies weight loss.  She has no neurologic complaints. She has no nausea, vomiting, constipation, or diarrhea.  She has no urinary complaints.  Patient offers no further specific complaints today.  REVIEW OF SYSTEMS:   Review of Systems  Constitutional: Negative.  Negative for fever, malaise/fatigue and weight loss.  Respiratory: Negative.  Negative for cough and shortness of breath.   Cardiovascular: Negative.  Negative for chest pain.  Gastrointestinal: Negative.  Negative for abdominal pain.  Musculoskeletal: Positive for joint pain.  Neurological: Negative.   Psychiatric/Behavioral: Negative.  The patient is not nervous/anxious.     As per HPI. Otherwise, a complete review of systems is negatve.  PAST MEDICAL HISTORY: Past Medical History:  Diagnosis Date  . Breast cancer ([place]) 2013   right breast cancer  . Cancer ([place])    breast  . Hypertension     PAST SURGICAL HISTORY: Past Surgical History:  Procedure Laterality Date  . BREAST SURGERY Right 04/20/2012   lumpectomy    FAMILY HISTORY Family History  Problem Relation Age of Onset  . Breast cancer Mother   . Breast cancer Maternal Aunt   . Breast cancer Cousin        ADVANCED DIRECTIVES:      HEALTH MAINTENANCE: Social History  Substance Use Topics  . Smoking status: Never Smoker  . Smokeless tobacco: Never Used  . Alcohol use Not on file     Colonoscopy:  PAP:  Bone density:  Lipid panel:  No Known Allergies  Current Outpatient Prescriptions  Medication Sig Dispense Refill  . amLODipine (NORVASC) 5 MG tablet daily.  6  . atorvastatin (LIPITOR) 20 MG tablet     . Coenzyme Q10 (CO Q 10 PO) Take 1 tablet by mouth daily.    . hydrochlorothiazide (MICROZIDE) 12.5 MG capsule Take 12.5 mg by mouth 2 (two) times daily.  6  . letrozole (FEMARA) 2.5 MG tablet Take 1 tablet (2.5 mg total) by mouth daily. 90 tablet 1  . Vitamin D, Ergocalciferol, (DRISDOL) 50000 units CAPS capsule TAKE ONE CAPSULE ONCE WEEKLY  2   No current facility-administered medications for this visit.     OBJECTIVE: Vitals:   07/23/16 1110  BP: 129/81  Pulse: 81  Temp: 97.6 F (36.4 C)     Body mass index is 35.85 kg/m.    ECOG FS:0 - Asymptomatic  General: Well-developed, well-nourished, no acute distress. Eyes: Pink conjunctiva, anicteric sclera. Breasts: Bilateral breast and axilla without lumps or masses. Lungs: Clear to auscultation bilaterally. Heart: Regular rate and rhythm. No rubs, murmurs, or gallops. Abdomen: Soft, nontender, nondistended. No organomegaly noted, normoactive bowel sounds. Musculoskeletal: No edema, cyanosis, or clubbing. Neuro: Alert, answering all questions appropriately. Cranial nerves grossly intact. Skin: No rashes or petechiae noted. Psych: Normal affect.  LAB RESULTS:  Lab Results  Component Value Date   NA 142 04/23/2012   K 4.2 04/23/2012   CL 109 (H) 04/23/2012   CO2 26 04/23/2012   GLUCOSE 85 04/23/2012   BUN 11 04/23/2012   CREATININE 0.78 04/23/2012   CALCIUM 8.6 04/23/2012   PROT 6.8 04/23/2012   ALBUMIN 3.5 04/23/2012   AST 21 04/23/2012   ALT 24 04/23/2012   ALKPHOS 88 04/23/2012   BILITOT 0.5 04/23/2012   GFRNONAA >60 04/23/2012    GFRAA >60 04/23/2012    Lab Results  Component Value Date   WBC 4.9 08/20/2012   NEUTROABS 3.7 08/20/2012   HGB 13.6 08/20/2012   HCT 40.9 08/20/2012   MCV 91 08/20/2012   PLT 175 08/20/2012     STUDIES: No results found.  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  PLAN:    1.  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 After a lengthy discussion with the patient, she declined adjuvant chemotherapy and wished to proceed with adjuvant XRT followed by an aromatase inhibitor for 5 years. Continue letrozle completing in September 2018.  Patient's most recent mammogram on March 25, 2016 was reported as BI-RADS 2, repeat in March 2018. Return to clinic in 6 months for routine evaluation. 2. Postmenopausal: Bone mineral density completed on July 27, 2015 had a reported T score of -1.0. This is considered normal. Repeat in July 2018.   3. Joint Pain: Patient held letrozole for 4 weeks, but her joint pain did not change therefore she restarted treatment.   Patient expressed understanding and was in agreement with this plan. She also understands that She can call clinic at any time with any questions, concerns, or complaints.   Breast cancer   Staging form: Breast, AJCC 7th Edition     Clinical stage from 07/30/2015: Stage IA (T1c, N0, M0) - Signed by Lloyd Huger, MD on 07/30/2015   Lloyd Huger, MD   07/25/2016 11:24 PM

## 2016-08-15 ENCOUNTER — Ambulatory Visit
Admission: RE | Admit: 2016-08-15 | Discharge: 2016-08-15 | Disposition: A | Discharge status: Home / Self Care | Payer: Medicare Other | Source: Ambulatory Visit | Attending: Radiation Oncology | Admitting: Radiation Oncology

## 2016-08-15 ENCOUNTER — Encounter: Payer: Self-pay | Admitting: Radiation Oncology

## 2016-08-15 VITALS — BP 149/89 | HR 82 | Temp 97.3°F | Resp 20 | Wt 200.3 lb

## 2016-08-15 DIAGNOSIS — M129 Arthropathy, unspecified: Secondary | ICD-10-CM | POA: Diagnosis not present

## 2016-08-15 DIAGNOSIS — Z923 Personal history of irradiation: Secondary | ICD-10-CM | POA: Diagnosis not present

## 2016-08-15 DIAGNOSIS — C50411 Malignant neoplasm of upper-outer quadrant of right female breast: Secondary | ICD-10-CM | POA: Insufficient documentation

## 2016-08-15 DIAGNOSIS — Z79811 Long term (current) use of aromatase inhibitors: Secondary | ICD-10-CM | POA: Diagnosis not present

## 2016-08-15 DIAGNOSIS — C50911 Malignant neoplasm of unspecified site of right female breast: Secondary | ICD-10-CM

## 2016-08-15 DIAGNOSIS — Z170 Estrogen receptor positive status [ER+]: Secondary | ICD-10-CM | POA: Insufficient documentation

## 2016-08-15 NOTE — Progress Notes (Signed)
Radiation Oncology Follow up Note  Name: Cindy [name]                      Date:   08/17/2015 MRN:  [id number] DOB: [birth year]                     This 70 y.o. 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 Tumor was ER/PR positive HER-2/neu not overexpressed. She's currently on letrozole tolerating that well. Her only complaint is some fullness/pain in the knee that has been longstanding and attributed to arthritis.  She specifically denies breast tenderness cough or bone pain.  COMPLICATIONS OF TREATMENT: none  FOLLOW UP COMPLIANCE: keeps appointments   ALLERGIES: NKDA MEDS: reviewed per EMR SH: non-smoker, volunteers here on mondays.  PHYSICAL EXAM:  Vitals:   08/15/16 0904  BP: (!) 149/89  Pulse: 82  Resp: 20  Temp: 97.3 F (36.3 C)   Awake, alert, NAD. PERRL, EOMI. neck clear without cervical or SCV adenopathy. No axillary adenopathy.  Breathing comfortbaly. Heart regular. Abdomen is benign with no organomegaly or masses noted. Gait and speech normal.  Breast exam: examined in 2 positions. No dominant mass or nodularity noted in either breast. Incision well healed. Cosmetic result excellent. No nipple discharge or drainage. No retraction.   RADIOLOGY RESULTS - mammograms from 03/25/16 reviewed - f/u 1 yr recommended.   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 Repeat mammograms 1 yr from prior. F/u with finnegan as planned. F/u in radiation oncology in 1 year.  Mallie Darting, MD PhD

## 2016-11-04 ENCOUNTER — Other Ambulatory Visit: Payer: Self-pay | Admitting: *Deleted

## 2016-11-04 MED ORDER — LETROZOLE 2.5 MG PO TABS: 2.5000 mg | ORAL_TABLET | Freq: Every day | ORAL | 1 refills | Status: DC

## 2017-01-27 NOTE — Progress Notes (Signed)
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  She is tolerating letrozole well without significant side effects. She continues to complain of mild joint pain, but this is chronic and unchanged. She otherwise feels well and is asymptomatic. She has no recent fevers or illnesses. She denies any chest pain or shortness of breath. She has a good appetite and denies weight loss.  She has no neurologic complaints. She has no nausea, vomiting, constipation, or diarrhea.  She has no urinary complaints.  Patient offers no further specific complaints today.  REVIEW OF SYSTEMS:   Review of Systems  Constitutional: Negative.  Negative for fever, malaise/fatigue and weight loss.  Respiratory: Negative.  Negative for cough and shortness of breath.   Cardiovascular: Negative.  Negative for chest pain and leg swelling.  Gastrointestinal: Negative.  Negative for abdominal pain.  Musculoskeletal: Positive for joint pain.  Neurological: Negative.  Negative for sensory change.  Psychiatric/Behavioral: Negative.  The patient is not nervous/anxious.     As per HPI. Otherwise, a complete review of systems is negatIve.  PAST MEDICAL HISTORY: Past Medical History:  Diagnosis Date  . Breast cancer ([place]) 2013   right breast cancer  . Cancer ([place])    breast  . Hypertension     PAST SURGICAL HISTORY: Past Surgical History:  Procedure Laterality Date  . BREAST SURGERY Right 04/20/2012   lumpectomy    FAMILY HISTORY Family History  Problem Relation Age of Onset  . Breast cancer Mother   . Breast cancer Maternal Aunt   . Breast  cancer Cousin        ADVANCED DIRECTIVES:    HEALTH MAINTENANCE: Social History  Substance Use Topics  . Smoking status: Never Smoker  . Smokeless tobacco: Never Used  . Alcohol use Not on file     Colonoscopy:  PAP:  Bone density:  Lipid panel:  Allergies  Allergen Reactions  . Niacin Rash    Hives    Current Outpatient Prescriptions  Medication Sig Dispense Refill  . acetaminophen (TYLENOL) 500 MG tablet Take 500 mg by mouth every 6 (six) hours as needed.    Marland Kitchen amLODipine (NORVASC) 5 MG tablet daily.  6  . atorvastatin (LIPITOR) 20 MG tablet Take 20 mg by mouth daily.     Marland Kitchen letrozole (FEMARA) 2.5 MG tablet Take 1 tablet (2.5 mg total) by mouth daily. 90 tablet 1  . naproxen sodium (ANAPROX) 220 MG tablet Take by mouth.    . Vitamin D, Ergocalciferol, (DRISDOL) 50000 units CAPS capsule TAKE ONE CAPSULE ONCE WEEKLY  2  . Coenzyme Q10 (CO Q 10 PO) Take 1 tablet by mouth daily.    . hydrochlorothiazide (MICROZIDE) 12.5 MG capsule Take 12.5 mg by mouth 2 (two) times daily.  6   No current facility-administered medications for this visit.     OBJECTIVE: Vitals:   01/28/17 0946  BP: 116/75  Pulse: 79  Resp: 18  Temp: 98.7 F (37.1 C)     Body mass index is 34.58 kg/m.    ECOG FS:0 - Asymptomatic  General: Well-developed, well-nourished, no acute distress. Eyes: Pink conjunctiva, anicteric sclera. Breasts: Bilateral breast and  axilla without lumps or masses. Lungs: Clear to auscultation bilaterally. Heart: Regular rate and rhythm. No rubs, murmurs, or gallops. Abdomen: Soft, nontender, nondistended. No organomegaly noted, normoactive bowel sounds. Musculoskeletal: No edema, cyanosis, or clubbing. Neuro: Alert, answering all questions appropriately. Cranial nerves grossly intact. Skin: No rashes or petechiae noted. Psych: Normal affect.  LAB RESULTS:  Lab Results  Component Value Date   NA 142 04/23/2012   K 4.2 04/23/2012   CL 109 (H) 04/23/2012   CO2 26  04/23/2012   GLUCOSE 85 04/23/2012   BUN 11 04/23/2012   CREATININE 0.78 04/23/2012   CALCIUM 8.6 04/23/2012   PROT 6.8 04/23/2012   ALBUMIN 3.5 04/23/2012   AST 21 04/23/2012   ALT 24 04/23/2012   ALKPHOS 88 04/23/2012   BILITOT 0.5 04/23/2012   GFRNONAA >60 04/23/2012   GFRAA >60 04/23/2012    Lab Results  Component Value Date   WBC 4.9 08/20/2012   NEUTROABS 3.7 08/20/2012   HGB 13.6 08/20/2012   HCT 40.9 08/20/2012   MCV 91 08/20/2012   PLT 175 08/20/2012     STUDIES: No results found.  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  PLAN:    1.  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 After a lengthy discussion with the patient, she declined adjuvant chemotherapy and wished to proceed with adjuvant XRT followed by an aromatase inhibitor for 5 years. Continue letrozle completing in September 2018.  Patient's most recent mammogram on March 25, 2016 was reported as BI-RADS 2, repeat in March 2018. Return to clinic in 6 months for routine evaluation. 2. Postmenopausal: Bone mineral density completed on July 27, 2015 had a reported T score of -1.0. This is considered normal. Repeat in July 2018.   3. Joint Pain: Patient held letrozole for 4 weeks, but her joint pain did not change therefore she restarted treatment.   Patient expressed understanding and was in agreement with this plan. She also understands that She can call clinic at any time with any questions, concerns, or complaints.   Breast cancer   Staging form: Breast, AJCC 7th Edition     Clinical stage from 07/30/2015: Stage IA (T1c, N0, M0) - Signed by Lloyd Huger, MD on 07/30/2015   Lloyd Huger, MD   01/28/2017 10:16  AM

## 2017-01-28 ENCOUNTER — Inpatient Hospital Stay: Payer: Medicare Other | Attending: Oncology | Admitting: Oncology

## 2017-01-28 VITALS — BP 116/75 | HR 79 | Temp 98.7°F | Resp 18 | Wt 195.2 lb

## 2017-01-28 DIAGNOSIS — Z79811 Long term (current) use of aromatase inhibitors: Secondary | ICD-10-CM | POA: Diagnosis not present

## 2017-01-28 DIAGNOSIS — C50411 Malignant neoplasm of upper-outer quadrant of right female breast: Secondary | ICD-10-CM | POA: Insufficient documentation

## 2017-01-28 DIAGNOSIS — Z170 Estrogen receptor positive status [ER+]: Secondary | ICD-10-CM | POA: Diagnosis not present

## 2017-01-28 DIAGNOSIS — Z923 Personal history of irradiation: Secondary | ICD-10-CM | POA: Insufficient documentation

## 2017-01-28 NOTE — Progress Notes (Signed)
Patient is here for follow up, she is doing well 

## 2017-03-27 ENCOUNTER — Ambulatory Visit
Admission: RE | Admit: 2017-03-27 | Discharge: 2017-03-27 | Disposition: A | Discharge status: Home / Self Care | Payer: Medicare Other | Source: Ambulatory Visit | Attending: Oncology | Admitting: Oncology

## 2017-03-27 DIAGNOSIS — Z170 Estrogen receptor positive status [ER+]: Principal | ICD-10-CM

## 2017-03-27 DIAGNOSIS — C50411 Malignant neoplasm of upper-outer quadrant of right female breast: Secondary | ICD-10-CM

## 2017-03-27 DIAGNOSIS — Z09 Encounter for follow-up examination after completed treatment for conditions other than malignant neoplasm: Secondary | ICD-10-CM | POA: Insufficient documentation

## 2017-03-27 DIAGNOSIS — Z853 Personal history of malignant neoplasm of breast: Secondary | ICD-10-CM | POA: Insufficient documentation

## 2017-05-15 ENCOUNTER — Telehealth: Payer: Self-pay | Admitting: *Deleted

## 2017-05-15 MED ORDER — LETROZOLE 2.5 MG PO TABS: 2.5000 mg | ORAL_TABLET | Freq: Every day | ORAL | 1 refills | Status: DC

## 2017-05-15 NOTE — Telephone Encounter (Signed)
Refilled letrozole per Dr. Grayland Ormond.

## 2017-07-16 ENCOUNTER — Encounter: Payer: Self-pay | Admitting: Emergency Medicine

## 2017-07-16 ENCOUNTER — Emergency Department: Payer: Medicare Other

## 2017-07-16 DIAGNOSIS — R2242 Localized swelling, mass and lump, left lower limb: Secondary | ICD-10-CM | POA: Diagnosis present

## 2017-07-16 DIAGNOSIS — Z79899 Other long term (current) drug therapy: Secondary | ICD-10-CM | POA: Diagnosis not present

## 2017-07-16 DIAGNOSIS — L03116 Cellulitis of left lower limb: Secondary | ICD-10-CM | POA: Insufficient documentation

## 2017-07-16 DIAGNOSIS — Z853 Personal history of malignant neoplasm of breast: Secondary | ICD-10-CM | POA: Diagnosis not present

## 2017-07-16 DIAGNOSIS — R600 Localized edema: Secondary | ICD-10-CM | POA: Diagnosis not present

## 2017-07-16 DIAGNOSIS — I10 Essential (primary) hypertension: Secondary | ICD-10-CM | POA: Diagnosis not present

## 2017-07-16 LAB — BRAIN NATRIURETIC PEPTIDE: B Natriuretic Peptide: 20 pg/mL (ref 0.0–100.0)

## 2017-07-16 LAB — HEPATIC FUNCTION PANEL
ALBUMIN: 3.1 g/dL — AB (ref 3.5–5.0)
ALT: 15 U/L (ref 14–54)
AST: 19 U/L (ref 15–41)
Alkaline Phosphatase: 110 U/L (ref 38–126)
Bilirubin, Direct: 0.1 mg/dL (ref 0.1–0.5)
Indirect Bilirubin: 0.6 mg/dL (ref 0.3–0.9)
TOTAL PROTEIN: 6.5 g/dL (ref 6.5–8.1)
Total Bilirubin: 0.7 mg/dL (ref 0.3–1.2)

## 2017-07-16 LAB — CBC WITH DIFFERENTIAL/PLATELET
BASOS ABS: 0.1 10*3/uL (ref 0–0.1)
Basophils Relative: 1 %
Eosinophils Absolute: 0.2 10*3/uL (ref 0–0.7)
Eosinophils Relative: 3 %
HCT: 35.9 % (ref 35.0–47.0)
HEMOGLOBIN: 12.1 g/dL (ref 12.0–16.0)
LYMPHS ABS: 1.5 10*3/uL (ref 1.0–3.6)
LYMPHS PCT: 20 %
MCH: 29.9 pg (ref 26.0–34.0)
MCHC: 33.7 g/dL (ref 32.0–36.0)
MCV: 88.5 fL (ref 80.0–100.0)
Monocytes Absolute: 0.7 10*3/uL (ref 0.2–0.9)
Monocytes Relative: 9 %
NEUTROS PCT: 67 %
Neutro Abs: 5.2 10*3/uL (ref 1.4–6.5)
Platelets: 277 10*3/uL (ref 150–440)
RBC: 4.05 MIL/uL (ref 3.80–5.20)
RDW: 13 % (ref 11.5–14.5)
WBC: 7.6 10*3/uL (ref 3.6–11.0)

## 2017-07-16 LAB — BASIC METABOLIC PANEL
ANION GAP: 9 (ref 5–15)
BUN: 16 mg/dL (ref 6–20)
CO2: 25 mmol/L (ref 22–32)
Calcium: 9.5 mg/dL (ref 8.9–10.3)
Chloride: 107 mmol/L (ref 101–111)
Creatinine, Ser: 0.58 mg/dL (ref 0.44–1.00)
Glucose, Bld: 145 mg/dL — ABNORMAL HIGH (ref 65–99)
POTASSIUM: 3.7 mmol/L (ref 3.5–5.1)
SODIUM: 141 mmol/L (ref 135–145)

## 2017-07-16 LAB — TROPONIN I: Troponin I: <0.03 ng/mL (ref <0.03)

## 2017-07-16 NOTE — ED Triage Notes (Addendum)
Patient increased bilateral foot swelling that has increased. Patient was started on 20mg  lasix last week but states she was going to make an apointment with her doctor in the morning but her left leg is tender and feels warm so she decided to come in. Pt in NAD, patient denies any chest pain or SOB.

## 2017-07-16 NOTE — ED Notes (Signed)
Talked to Dr. Mable Paris about orders and he put in order to be done in triage.

## 2017-07-17 ENCOUNTER — Emergency Department: Payer: Medicare Other

## 2017-07-17 ENCOUNTER — Emergency Department
Admission: EM | Admit: 2017-07-17 | Discharge: 2017-07-17 | Disposition: A | Discharge status: Home / Self Care | Payer: Medicare Other | Attending: Emergency Medicine | Admitting: Emergency Medicine

## 2017-07-17 DIAGNOSIS — R609 Edema, unspecified: Secondary | ICD-10-CM

## 2017-07-17 DIAGNOSIS — R600 Localized edema: Secondary | ICD-10-CM

## 2017-07-17 DIAGNOSIS — L03116 Cellulitis of left lower limb: Secondary | ICD-10-CM

## 2017-07-17 HISTORY — DX: Hyperlipidemia, unspecified: E78.5

## 2017-07-17 LAB — URINALYSIS, COMPLETE (UACMP) WITH MICROSCOPIC
BILIRUBIN URINE: NEGATIVE
Bacteria, UA: NONE SEEN
Glucose, UA: NEGATIVE mg/dL
Hgb urine dipstick: NEGATIVE
Ketones, ur: NEGATIVE mg/dL
LEUKOCYTES UA: NEGATIVE
Nitrite: NEGATIVE
PH: 5 (ref 5.0–8.0)
Protein, ur: NEGATIVE mg/dL
RBC / HPF: NONE SEEN RBC/hpf (ref 0–5)
SPECIFIC GRAVITY, URINE: 1.024 (ref 1.005–1.030)

## 2017-07-17 MED ORDER — CEPHALEXIN 500 MG PO CAPS
500.0000 mg | ORAL_CAPSULE | Freq: Once | ORAL | Status: AC
  Administered 2017-07-17: 500 mg via ORAL
  Filled 2017-07-17: qty 1

## 2017-07-17 MED ORDER — CEPHALEXIN 500 MG PO CAPS: 500.0000 mg | ORAL_CAPSULE | Freq: Four times a day (QID) | ORAL | 0 refills | Status: DC

## 2017-07-17 MED ORDER — ONDANSETRON 4 MG PO TBDP: ORAL_TABLET | ORAL | 0 refills | Status: DC

## 2017-07-17 NOTE — ED Notes (Signed)
Pt returned from ultrasound

## 2017-07-17 NOTE — Discharge Instructions (Signed)
As we discussed, your workup was generally reassuring today.  Your lab work was normal and your ultrasounds of both lower legs did not reveal any blood clots.  However, your heart rate is a little bit fast and you do have some redness and warmth on your left lower leg which may represent a mild infection called cellulitis.  To make sure that this does not get any worse we decided to treat you with antibiotics rather than wait to see if he gets worse and then start treatment.  Please take the full course of antibiotics (4 times a day for 10 days) and less your doctor specifically tells you to stop taking it.  We do recommend that you take an additional dose of furosemide 20 mg tonight and another one in the morning and continue taking it daily until you see your doctor.  Use your compression stockings and keep your legs elevated whenever possible.  Return to the emergency department if you develop new or worsening symptoms that concern you.

## 2017-07-17 NOTE — ED Provider Notes (Signed)
[facility] Emergency Department Provider Note  ____________________________________________   First MD Initiated Contact with Patient 07/17/17 0116     (approximate)  I have reviewed the triage vital signs and the nursing notes.   HISTORY  Chief Complaint Leg Swelling    HPI Cindy [name] is a 71 y.o. female with medical history as listed below presents for evaluation of increased bilateral lower extremity swelling in spite of using Lasix.  Her regular doctor has given her prescription for furosemide 20 mg by mouth as needed in the morning for increased swelling.  She states she has been using this since last week but it has not improved and she continues to have swelling.  She is able to ambulate without difficulty.  Swelling does seem to be worse at night.  She has not been using her compression stockings but discharged keep her legs elevated when she is at rest.  She has not been on any long trips recently and has had no surgeries and does not have active cancer.  She has no history of blood clots in her legs nor her lungs.  She does not take any anticoagulation.  She was concerned about the increased swelling particularly when she noticed that in her left lower extremity she seems to have some redness that is spreading up the leg and it feels warm to the touch.  She reports the symptoms as moderate and nothing seems to be making them better or worse.  There is no pain, just a sensation of pressure or tightness.  She denies fever/chills, chest pain, shortness of breath, nausea, vomiting, abdominal pain.   Past Medical History:  Diagnosis Date  . Breast cancer ([place]) 2013   right breast cancer lumpectomy and rad tx  . Cancer ([place])    breast  . Hyperlipidemia   . Hypertension     Patient Active Problem List   Diagnosis Date Noted  . Breast cancer of upper-outer quadrant of right female breast ([place]) 07/30/2015    Past Surgical History:  Procedure  Laterality Date  . BREAST LUMPECTOMY Right 04/20/2012   re excision 05/26/2012.  Radiation tx  . BREAST SURGERY Right 04/20/2012   lumpectomy    Prior to Admission medications   Medication Sig Start Date End Date Taking? Authorizing Provider  acetaminophen (TYLENOL) 500 MG tablet Take 500 mg by mouth every 6 (six) hours as needed.    [provider]  amLODipine (NORVASC) 5 MG tablet daily. 06/13/15   [provider]  atorvastatin (LIPITOR) 20 MG tablet Take 20 mg by mouth daily.  07/17/15   [provider]  cephALEXin (KEFLEX) 500 MG capsule Take 1 capsule (500 mg total) by mouth 4 (four) times daily. 07/17/17   Hinda Kehr, MD  Coenzyme Q10 (CO Q 10 PO) Take 1 tablet by mouth daily.    [provider]  hydrochlorothiazide (MICROZIDE) 12.5 MG capsule Take 12.5 mg by mouth 2 (two) times daily. 06/26/15   [provider]  letrozole (FEMARA) 2.5 MG tablet Take 1 tablet (2.5 mg total) by mouth daily. 05/15/17   Lloyd Huger, MD  naproxen sodium (ANAPROX) 220 MG tablet Take by mouth.    [provider]  ondansetron (ZOFRAN ODT) 4 MG disintegrating tablet Allow 1-2 tablets to dissolve in your mouth every 8 hours as needed for nausea/vomiting 07/17/17   Hinda Kehr, MD  Vitamin D, Ergocalciferol, (DRISDOL) 50000 units CAPS capsule TAKE ONE CAPSULE ONCE WEEKLY 06/04/16   [provider]    Allergies Niacin  Family History  Problem Relation Age of Onset  . Breast cancer Cousin   . Breast cancer Mother        63's  . Breast cancer Maternal Aunt        60's    Social History Social History  Substance Use Topics  . Smoking status: Never Smoker  . Smokeless tobacco: Never Used  . Alcohol use No    Review of Systems Constitutional: No fever/chills Eyes: No visual changes. ENT: No sore throat. Cardiovascular: Denies chest pain. Respiratory: Denies shortness of breath. Gastrointestinal: No abdominal pain.  No nausea, no  vomiting.  No diarrhea.  No constipation. Genitourinary: Negative for dysuria. Musculoskeletal: Worsening lower extremity swelling bilaterally with some redness and warmth on the left Integumentary: Negative for rash. Neurological: Negative for headaches, focal weakness or numbness.   ____________________________________________   PHYSICAL EXAM:  VITAL SIGNS: ED Triage Vitals  Enc Vitals Group     BP 07/16/17 2215 (!) 171/75     Pulse Rate 07/16/17 2215 (!) 133     Resp 07/16/17 2215 18     Temp 07/16/17 2215 98.4 F (36.9 C)     Temp src --      SpO2 07/16/17 2215 96 %     Weight 07/16/17 2216 87.1 kg (192 lb)     Height 07/16/17 2216 1.6 m (5\' 3" )     Head Circumference --      Peak Flow --      Pain Score --      Pain Loc --      Pain Edu? --      Excl. in [place]? --     Constitutional: Alert and oriented. Well appearing and in no acute distress. Eyes: Conjunctivae are normal.  Head: Atraumatic. Nose: No congestion/rhinnorhea. Mouth/Throat: Mucous membranes are moist. Neck: No stridor.  No meningeal signs.   Cardiovascular: Tachycardia, regular rhythm. Good peripheral circulation. Grossly normal heart sounds. Respiratory: Normal respiratory effort.  No retractions. Lungs CTAB. Gastrointestinal: Soft and nontender. No distention.  Musculoskeletal: 2+ pitting edema most notable and bilateral feet but then 1+ pitting edema in bilateral lower extremities stopping at the level of the knee.  The patient also has some very mild erythema in a variable pattern on her anterior left lower leg.  It does feel warmer to the touch than on other places on her extremities.  Nontender.  No palpable cordlike structures in the popliteal fossa or in the deep venous distribution Neurologic:  Normal speech and language. No gross focal neurologic deficits are appreciated.  Skin:  Skin is warm, dry and intact. No rash noted. Psychiatric: Mood and affect are normal. Speech and behavior are  normal.  ____________________________________________   LABS (all labs ordered are listed, but only abnormal results are displayed)  Labs Reviewed  BASIC METABOLIC PANEL - Abnormal; Notable for the following:       Result Value   Glucose, Bld 145 (*)    All other components within normal limits  HEPATIC FUNCTION PANEL - Abnormal; Notable for the following:    Albumin 3.1 (*)    All other components within normal limits  URINALYSIS, COMPLETE (UACMP) WITH MICROSCOPIC - Abnormal; Notable for the following:    Color, Urine YELLOW (*)    APPearance CLEAR (*)    Squamous Epithelial / LPF 0-5 (*)    All other components within normal limits  BRAIN NATRIURETIC PEPTIDE  TROPONIN I  CBC WITH  DIFFERENTIAL/PLATELET   ____________________________________________  EKG  ED ECG REPORT I, Edger Husain, the attending physician, personally viewed and interpreted this ECG.  Date: 07/16/2017 EKG Time: 22:20 Rate: 122 Rhythm: Sinus tachycardia QRS Axis: normal Intervals: normal ST/T Wave abnormalities: normal Narrative Interpretation: unremarkable  ____________________________________________  RADIOLOGY   Dg Chest 2 View  Result Date: 07/16/2017 CLINICAL DATA:  Initial evaluation for acute bilateral foot swelling with shortness of breath. EXAM: CHEST  2 VIEW COMPARISON:  Prior radiograph from 04/23/2012. FINDINGS: The cardiac and mediastinal silhouettes are stable in size and contour, and remain within normal limits. The lungs are normally inflated. Mild perihilar vascular congestion without pulmonary edema. No pleural effusion. No focal infiltrates. No pneumothorax. No acute osseous abnormality identified. IMPRESSION: 1. Mild perihilar vascular congestion without overt pulmonary edema. 2. No other active cardiopulmonary disease. Electronically Signed   By: Jeannine Boga M.D.   On: 07/16/2017 23:11   US Venous Img Lower Bilateral  Result Date: 07/17/2017 CLINICAL DATA:   71 year old female with bilateral lower extremity swelling. EXAM: BILATERAL LOWER EXTREMITY VENOUS DOPPLER ULTRASOUND TECHNIQUE: Gray-scale sonography with graded compression, as well as color Doppler and duplex ultrasound were performed to evaluate the lower extremity deep venous systems from the level of the common femoral vein and including the common femoral, femoral, profunda femoral, popliteal and calf veins including the posterior tibial, peroneal and gastrocnemius veins when visible. The superficial great saphenous vein was also interrogated. Spectral Doppler was utilized to evaluate flow at rest and with distal augmentation maneuvers in the common femoral, femoral and popliteal veins. COMPARISON:  None. FINDINGS: RIGHT LOWER EXTREMITY Common Femoral Vein: No evidence of thrombus. Normal compressibility, respiratory phasicity and response to augmentation. Saphenofemoral Junction: No evidence of thrombus. Normal compressibility and flow on color Doppler imaging. Profunda Femoral Vein: No evidence of thrombus. Normal compressibility and flow on color Doppler imaging. Femoral Vein: No evidence of thrombus. Normal compressibility, respiratory phasicity and response to augmentation. Popliteal Vein: No evidence of thrombus. Normal compressibility, respiratory phasicity and response to augmentation. Calf Veins: No evidence of thrombus. Normal compressibility and flow on color Doppler imaging. Superficial Great Saphenous Vein: No evidence of thrombus. Normal compressibility and flow on color Doppler imaging. Venous Reflux:  None. Other Findings:  None. LEFT LOWER EXTREMITY Common Femoral Vein: No evidence of thrombus. Normal compressibility, respiratory phasicity and response to augmentation. Saphenofemoral Junction: No evidence of thrombus. Normal compressibility and flow on color Doppler imaging. Profunda Femoral Vein: No evidence of thrombus. Normal compressibility and flow on color Doppler imaging. Femoral Vein:  No evidence of thrombus. Normal compressibility, respiratory phasicity and response to augmentation. Popliteal Vein: No evidence of thrombus. Normal compressibility, respiratory phasicity and response to augmentation. Calf Veins: No evidence of thrombus. Normal compressibility and flow on color Doppler imaging. Superficial Great Saphenous Vein: No evidence of thrombus. Normal compressibility and flow on color Doppler imaging. Venous Reflux:  None. Other Findings:  None. IMPRESSION: No evidence of DVT within either lower extremity. Electronically Signed   By: Anner Crete M.D.   On: 07/17/2017 02:57    ____________________________________________   PROCEDURES  Critical Care performed: No   Procedure(s) performed:   Procedures   ____________________________________________   INITIAL IMPRESSION / ASSESSMENT AND PLAN / ED COURSE  Pertinent labs & imaging results that were available during my care of the patient were reviewed by me and considered in my medical decision making (see chart for details).  The patient's workup is reassuring and she is in no acute distress and  ambulatory without difficulty.  In addition to bilateral peripheral edema that I suspect is acute on chronic and mostly is a fluid/volume issue, she also has some redness most notable on the anterior portion of her left lower leg.  It is a little bit warm compared to the rest of it but does not appear to be completely consistent with cellulitis and it is nontender.  I discussed obtaining an ultrasound with the patient and she is in agreement in order to rule out DVT.   Clinical Course as of Jul 17 328  Thu Jul 17, 2017  0303 No evidence of DVT, will reassure patient and recommend follow up as outpatient US Venous Img Lower Bilateral [CF]  0325 Given the patient's tachycardia and redness/warmth of the lower extremity, I will treat empirically for the possibility of cellulitis.  Even though it is not completely consistent  with cellulitis it is a change from her norm and it would explain her tachycardia even though she does not have a leukocytosis.  I would rather err on the side of caution.  I discussed all of this with the patient including possible GI side effects from the Keflex.  I encouraged her to call later today to establish a follow-up appointment with her PCP and I encouraged her to take the full course of antibiotics as prescribed.  I gave my usual and customary return precautions.   She is comfortable with the plan.  [CF]    Clinical Course User Index [CF] Hinda Kehr, MD    ____________________________________________  FINAL CLINICAL IMPRESSION(S) / ED DIAGNOSES  Final diagnoses:  Peripheral edema  Cellulitis of left lower extremity     MEDICATIONS GIVEN DURING THIS VISIT:  Medications  cephALEXin (KEFLEX) capsule 500 mg (not administered)     NEW OUTPATIENT MEDICATIONS STARTED DURING THIS VISIT:  New Prescriptions   CEPHALEXIN (KEFLEX) 500 MG CAPSULE    Take 1 capsule (500 mg total) by mouth 4 (four) times daily.   ONDANSETRON (ZOFRAN ODT) 4 MG DISINTEGRATING TABLET    Allow 1-2 tablets to dissolve in your mouth every 8 hours as needed for nausea/vomiting    Modified Medications   No medications on file    Discontinued Medications   No medications on file     Note:  This document was prepared using Dragon voice recognition software and may include unintentional dictation errors.    Hinda Kehr, MD 07/17/17 [phone]

## 2017-07-29 ENCOUNTER — Ambulatory Visit: Payer: Medicare Other | Admitting: Oncology

## 2017-07-29 ENCOUNTER — Ambulatory Visit
Admission: RE | Admit: 2017-07-29 | Discharge: 2017-07-29 | Disposition: A | Discharge status: Home / Self Care | Payer: Medicare Other | Source: Ambulatory Visit | Attending: Oncology | Admitting: Oncology

## 2017-07-29 DIAGNOSIS — C50411 Malignant neoplasm of upper-outer quadrant of right female breast: Secondary | ICD-10-CM | POA: Diagnosis not present

## 2017-07-29 DIAGNOSIS — Z79899 Other long term (current) drug therapy: Secondary | ICD-10-CM | POA: Insufficient documentation

## 2017-07-29 DIAGNOSIS — Z170 Estrogen receptor positive status [ER+]: Secondary | ICD-10-CM | POA: Diagnosis not present

## 2017-07-29 DIAGNOSIS — Z780 Asymptomatic menopausal state: Secondary | ICD-10-CM | POA: Insufficient documentation

## 2017-08-05 ENCOUNTER — Inpatient Hospital Stay: Payer: Medicare Other | Admitting: Oncology

## 2017-08-13 ENCOUNTER — Ambulatory Visit: Payer: Medicare Other | Admitting: Radiation Oncology

## 2017-08-30 HISTORY — PX: THYROIDECTOMY: SHX17

## 2017-09-13 NOTE — Progress Notes (Signed)
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 Patient has now discontinued letrozole after completing 5 years of treatment. She is being actively evaluated and treatment for "thyroid problems", but otherwise feels well. She has no neurologic complaints. Eyes any recent fevers or illnesses. She denies any chest pain or shortness of breath. She has a good appetite and denies weight loss.  She has no neurologic complaints. She has no nausea, vomiting, constipation, or diarrhea.  She has no urinary complaints.  Patient offers no further specific complaints today.  REVIEW OF SYSTEMS:   Review of Systems  Constitutional: Negative.  Negative for fever, malaise/fatigue and weight loss.  HENT: Negative.   Respiratory: Negative.  Negative for cough and shortness of breath.   Cardiovascular: Negative.  Negative for chest pain and leg swelling.  Gastrointestinal: Negative.  Negative for abdominal pain.  Musculoskeletal: Negative.  Negative for joint pain.  Skin: Negative.  Negative for rash.  Neurological: Negative.  Negative for sensory change.  Psychiatric/Behavioral: Negative.  The patient is not nervous/anxious.     As per HPI. Otherwise, a complete review of systems is negative.  PAST MEDICAL HISTORY: Past Medical History:  Diagnosis Date  . Breast cancer ([place]) 2013   right breast cancer lumpectomy and rad tx  . Cancer ([place])    breast  . Hyperlipidemia   . Hypertension     PAST SURGICAL HISTORY: Past Surgical History:  Procedure Laterality Date  . BREAST LUMPECTOMY Right 04/20/2012   re excision 05/26/2012.   Radiation tx  . BREAST SURGERY Right 04/20/2012   lumpectomy    FAMILY HISTORY Family History  Problem Relation Age of Onset  . Breast cancer Cousin   . Breast cancer Mother        74's  . Breast cancer Maternal Aunt        60's       ADVANCED DIRECTIVES:    HEALTH MAINTENANCE: Social History  Substance Use Topics  . Smoking status: Never Smoker  . Smokeless tobacco: Never Used  . Alcohol use No     Colonoscopy:  PAP:  Bone density:  Lipid panel:  Allergies  Allergen Reactions  . Niacin Rash    Hives    Current Outpatient Prescriptions  Medication Sig Dispense Refill  . acetaminophen (TYLENOL) 500 MG tablet Take 500 mg by mouth every 6 (six) hours as needed.    Marland Kitchen amLODipine (NORVASC) 5 MG tablet daily.  6  . atorvastatin (LIPITOR) 20 MG tablet Take 20 mg by mouth daily.     . furosemide (LASIX) 20 MG tablet Take 20 mg by mouth.    . methimazole (TAPAZOLE) 10 MG tablet Take 20 mg by mouth.    . metoprolol tartrate (LOPRESSOR) 25 MG tablet Take 25 mg by mouth.    . naproxen sodium (ANAPROX) 220 MG tablet Take by mouth.    . Vitamin D, Ergocalciferol, (DRISDOL) 50000 units CAPS capsule TAKE ONE CAPSULE ONCE WEEKLY  2  . letrozole (FEMARA) 2.5 MG tablet Take 1 tablet (2.5 mg total) by mouth daily. (Patient not taking: Reported on 09/15/2017) 90 tablet 1  . ondansetron (ZOFRAN ODT) 4 MG  disintegrating tablet Allow 1-2 tablets to dissolve in your mouth every 8 hours as needed for nausea/vomiting (Patient not taking: Reported on 09/15/2017) 30 tablet 0   No current facility-administered medications for this visit.     OBJECTIVE: Vitals:   09/15/17 1512  BP: 108/69  Pulse: 69  Resp: 18  Temp: 97.8 F (36.6 C)     Body mass index is 31.96 kg/m.    ECOG FS:0 - Asymptomatic  General: Well-developed, well-nourished, no acute distress. Eyes: Pink conjunctiva, anicteric sclera. Breasts: Bilateral breast and axilla without lumps or masses. Patient requested exam be  deferred today. Lungs: Clear to auscultation bilaterally. Heart: Regular rate and rhythm. No rubs, murmurs, or gallops. Abdomen: Soft, nontender, nondistended. No organomegaly noted, normoactive bowel sounds. Musculoskeletal: No edema, cyanosis, or clubbing. Neuro: Alert, answering all questions appropriately. Cranial nerves grossly intact. Skin: No rashes or petechiae noted. Psych: Normal affect.  LAB RESULTS:  Lab Results  Component Value Date   NA 141 07/16/2017   K 3.7 07/16/2017   CL 107 07/16/2017   CO2 25 07/16/2017   GLUCOSE 145 (H) 07/16/2017   BUN 16 07/16/2017   CREATININE 0.58 07/16/2017   CALCIUM 9.5 07/16/2017   PROT 6.5 07/16/2017   ALBUMIN 3.1 (L) 07/16/2017   AST 19 07/16/2017   ALT 15 07/16/2017   ALKPHOS 110 07/16/2017   BILITOT 0.7 07/16/2017   GFRNONAA >60 07/16/2017   GFRAA >60 07/16/2017    Lab Results  Component Value Date   WBC 7.6 07/16/2017   NEUTROABS 5.2 07/16/2017   HGB 12.1 07/16/2017   HCT 35.9 07/16/2017   MCV 88.5 07/16/2017   PLT 277 07/16/2017     STUDIES: No results found.  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  PLAN:    1.  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 After a lengthy discussion with the patient, she declined adjuvant chemotherapy and wished to proceed with adjuvant XRT followed by an aromatase inhibitor for 5 years. Patient completed 5 years of letrozole in September 2018. Patient's most recent mammogram on March 25, 2016 was reported as BI-RADS 2, repeat in March 2018. Return to clinic in 1 year for routine evaluation. 2. Postmenopausal: Bone mineral density completed on July 29, 2017 had a reported T score of -0.5. This is  considered normal. Repeat in July 2020.  Bone mineral densities can now be ordered by patient's primary care physician. 3. Joint Pain: Patient does not complain of this today. 4. Thyroid: Continue evaluation and treatment per [facility] endocrinology.   Patient expressed understanding and was in agreement with this plan. She also understands that She can call clinic at any time with any questions, concerns, or complaints.   Breast cancer   Staging form: Breast, AJCC 7th Edition     Clinical stage from 07/30/2015: Stage IA (T1c, N0, M0) - Signed by Lloyd Huger, MD on 07/30/2015   Lloyd Huger, MD   09/15/2017 4:05 PM

## 2017-09-15 ENCOUNTER — Inpatient Hospital Stay: Payer: Medicare Other | Attending: Oncology | Admitting: Oncology

## 2017-09-15 VITALS — BP 108/69 | HR 69 | Temp 97.8°F | Resp 18 | Wt 180.4 lb

## 2017-09-15 DIAGNOSIS — Z170 Estrogen receptor positive status [ER+]: Secondary | ICD-10-CM | POA: Insufficient documentation

## 2017-09-15 DIAGNOSIS — I10 Essential (primary) hypertension: Secondary | ICD-10-CM | POA: Diagnosis not present

## 2017-09-15 DIAGNOSIS — Z9223 Personal history of estrogen therapy: Secondary | ICD-10-CM | POA: Insufficient documentation

## 2017-09-15 DIAGNOSIS — Z923 Personal history of irradiation: Secondary | ICD-10-CM | POA: Diagnosis not present

## 2017-09-15 DIAGNOSIS — C50411 Malignant neoplasm of upper-outer quadrant of right female breast: Secondary | ICD-10-CM

## 2017-09-15 DIAGNOSIS — E785 Hyperlipidemia, unspecified: Secondary | ICD-10-CM | POA: Diagnosis not present

## 2017-09-15 DIAGNOSIS — Z853 Personal history of malignant neoplasm of breast: Secondary | ICD-10-CM | POA: Diagnosis not present

## 2017-10-10 DIAGNOSIS — E890 Postprocedural hypothyroidism: Secondary | ICD-10-CM | POA: Insufficient documentation

## 2017-11-14 ENCOUNTER — Ambulatory Visit
Admission: RE | Admit: 2017-11-14 | Discharge: 2017-11-14 | Disposition: A | Discharge status: Home / Self Care | Payer: Medicare Other | Source: Ambulatory Visit | Attending: Ophthalmology | Admitting: Ophthalmology

## 2017-11-14 ENCOUNTER — Other Ambulatory Visit: Payer: Self-pay | Admitting: Ophthalmology

## 2017-11-14 DIAGNOSIS — H547 Unspecified visual loss: Secondary | ICD-10-CM | POA: Diagnosis present

## 2017-11-14 DIAGNOSIS — H269 Unspecified cataract: Secondary | ICD-10-CM | POA: Insufficient documentation

## 2017-11-14 DIAGNOSIS — H5462 Unqualified visual loss, left eye, normal vision right eye: Secondary | ICD-10-CM

## 2017-11-14 DIAGNOSIS — G589 Mononeuropathy, unspecified: Secondary | ICD-10-CM | POA: Diagnosis not present

## 2017-11-14 LAB — POCT I-STAT CREATININE: CREATININE: 0.9 mg/dL (ref 0.44–1.00)

## 2017-11-14 MED ORDER — GADOBENATE DIMEGLUMINE 529 MG/ML IV SOLN
20.0000 mL | Freq: Once | INTRAVENOUS | Status: AC | PRN
  Administered 2017-11-14: 16 mL via INTRAVENOUS

## 2017-11-25 ENCOUNTER — Ambulatory Visit
Admission: RE | Admit: 2017-11-25 | Discharge: 2017-11-25 | Disposition: A | Discharge status: Home / Self Care | Payer: Medicare Other | Source: Ambulatory Visit | Attending: Ophthalmology | Admitting: Ophthalmology

## 2017-11-25 DIAGNOSIS — E0500 Thyrotoxicosis with diffuse goiter without thyrotoxic crisis or storm: Secondary | ICD-10-CM | POA: Insufficient documentation

## 2017-11-25 DIAGNOSIS — H40053 Ocular hypertension, bilateral: Secondary | ICD-10-CM | POA: Diagnosis not present

## 2017-11-25 MED ORDER — SODIUM CHLORIDE 0.9 % IV SOLN
1000.0000 mg | Freq: Once | INTRAVENOUS | Status: AC
  Administered 2017-11-25: 1000 mg via INTRAVENOUS
  Filled 2017-11-25: qty 8

## 2017-11-25 NOTE — Discharge Instructions (Signed)
Methylprednisolone Solution for Injection What is this medicine? METHYLPREDNISOLONE (meth ill pred NISS oh lone) is a corticosteroid. It is commonly used to treat inflammation of the skin, joints, lungs, and other organs. Common conditions treated include asthma, allergies, and arthritis. It is also used for other conditions, such as blood disorders and diseases of the adrenal glands. This medicine may be used for other purposes; ask your health care provider or pharmacist if you have questions. COMMON BRAND NAME(S): A-Methapred, Solu-Medrol What should I tell my health care provider before I take this medicine? They need to know if you have any of these conditions: -Cushing's syndrome -eye disease, vision problems -diabetes -glaucoma -heart disease -high blood pressure -infection (especially a virus infection such as chickenpox, cold sores, or herpes) -liver disease -mental illness -myasthenia gravis -osteoporosis -recently received or scheduled to receive a vaccine -seizures -stomach or intestine problems -thyroid disease -an unusual or allergic reaction to lactose, methylprednisolone, other medicines, foods, dyes, or preservatives -pregnant or trying to get pregnant -breast-feeding How should I use this medicine? This medicine is for injection or infusion into a vein. It is also for injection into a muscle. It is given by a health care professional in a hospital or clinic setting. Talk to your pediatrician regarding the use of this medicine in children. While this drug may be prescribed for selected conditions, precautions do apply. Overdosage: If you think you have taken too much of this medicine contact a poison control center or emergency room at once. NOTE: This medicine is only for you. Do not share this medicine with others. What if I miss a dose? This does not apply. What may interact with this medicine? Do not take this medicine with any of the following  medications: -alefacept -echinacea -iopamidol -live virus vaccines -metyrapone -mifepristone This medicine may also interact with the following medications: -amphotericin B -aspirin and aspirin-like medicines -certain antibiotics like erythromycin, clarithromycin, troleandomycin -certain medicines for diabetes -certain medicines for fungal infection like ketoconazole -certain medicines for seizures like carbamazepine, phenobarbital, phenytoin -certain medicines that treat or prevent blood clots like warfarin -cyclosporine -digoxin -diuretics -female hormones, like estrogens and birth control pills -isoniazid -NSAIDS, medicines for pain and inflammation, like ibuprofen or naproxen -other medicines for myasthenia gravis -rifampin -vaccines This list may not describe all possible interactions. Give your health care provider a list of all the medicines, herbs, non-prescription drugs, or dietary supplements you use. Also tell them if you smoke, drink alcohol, or use illegal drugs. Some items may interact with your medicine. What should I watch for while using this medicine? Tell your doctor or healthcare professional if your symptoms do not start to get better or if they get worse. Do not stop taking except on your doctor's advice. You may develop a severe reaction. Your doctor will tell you how much medicine to take. Your condition will be monitored carefully while you are receiving this medicine. This medicine may increase your risk of getting an infection. Tell your doctor or health care professional if you are around anyone with measles or chickenpox, or if you develop sores or blisters that do not heal properly. This medicine may affect blood sugar levels. If you have diabetes, check with your doctor or health care professional before you change your diet or the dose of your diabetic medicine. Tell your doctor or health care professional right away if you have any change in your  eyesight. Using this medicine for a long time may increase your risk of low bone  mass. Talk to your doctor about bone health. °What side effects may I notice from receiving this medicine? °Side effects that you should report to your doctor or health care professional as soon as possible: °-allergic reactions like skin rash, itching or hives, swelling of the face, lips, or tongue °-bloody or tarry stools °-changes in vision °-hallucination, loss of contact with reality °-muscle cramps °-muscle pain °-palpitations °-signs and symptoms of high blood sugar such as dizziness; dry mouth; dry skin; fruity breath; nausea; stomach pain; increased hunger or thirst; increased urination °-signs and symptoms of infection like fever or chills; cough; sore throat; pain or trouble passing urine °-trouble passing urine or change in the amount of urine °Side effects that usually do not require medical attention (report to your doctor or health care professional if they continue or are bothersome): °-changes in emotions or mood °-constipation °-diarrhea °-excessive hair growth on the face or body °-headache °-nausea, vomiting °-pain, redness, or irritation at site where injected °-trouble sleeping °-weight gain °This list may not describe all possible side effects. Call your doctor for medical advice about side effects. You may report side effects to FDA at 1-800-FDA-1088. °Where should I keep my medicine? °This drug is given in a hospital or clinic and will not be stored at home. °NOTE: This sheet is a summary. It may not cover all possible information. If you have questions about this medicine, talk to your doctor, pharmacist, or health care provider. °© 2018 [employer] (2016-02-22 16:21:28) ° °

## 2017-11-25 NOTE — OR Nursing (Signed)
Copy of discharge instructions given to patient.

## 2017-12-12 ENCOUNTER — Ambulatory Visit
Admission: RE | Admit: 2017-12-12 | Discharge: 2017-12-12 | Disposition: A | Discharge status: Home / Self Care | Payer: Medicare Other | Source: Ambulatory Visit | Attending: Ophthalmology | Admitting: Ophthalmology

## 2017-12-12 DIAGNOSIS — E0500 Thyrotoxicosis with diffuse goiter without thyrotoxic crisis or storm: Secondary | ICD-10-CM | POA: Diagnosis not present

## 2017-12-12 DIAGNOSIS — H40053 Ocular hypertension, bilateral: Secondary | ICD-10-CM | POA: Insufficient documentation

## 2017-12-12 LAB — COMPREHENSIVE METABOLIC PANEL
ALT: 12 U/L — AB (ref 14–54)
ANION GAP: 8 (ref 5–15)
AST: 17 U/L (ref 15–41)
Albumin: 3.4 g/dL — ABNORMAL LOW (ref 3.5–5.0)
Alkaline Phosphatase: 112 U/L (ref 38–126)
BUN: 13 mg/dL (ref 6–20)
CHLORIDE: 107 mmol/L (ref 101–111)
CO2: 25 mmol/L (ref 22–32)
CREATININE: 0.84 mg/dL (ref 0.44–1.00)
Calcium: 9 mg/dL (ref 8.9–10.3)
Glucose, Bld: 102 mg/dL — ABNORMAL HIGH (ref 65–99)
POTASSIUM: 3.7 mmol/L (ref 3.5–5.1)
Sodium: 140 mmol/L (ref 135–145)
Total Bilirubin: 0.8 mg/dL (ref 0.3–1.2)
Total Protein: 6.5 g/dL (ref 6.5–8.1)

## 2017-12-12 MED ORDER — METHYLPREDNISOLONE SODIUM SUCC 1000 MG IJ SOLR
1000.0000 mg | Freq: Once | INTRAMUSCULAR | Status: AC
  Administered 2017-12-12: 1000 mg via INTRAVENOUS
  Filled 2017-12-12: qty 8

## 2017-12-19 ENCOUNTER — Ambulatory Visit
Admission: RE | Admit: 2017-12-19 | Discharge: 2017-12-19 | Disposition: A | Discharge status: Home / Self Care | Payer: Medicare Other | Source: Ambulatory Visit | Attending: Ophthalmology | Admitting: Ophthalmology

## 2017-12-19 DIAGNOSIS — E0500 Thyrotoxicosis with diffuse goiter without thyrotoxic crisis or storm: Secondary | ICD-10-CM | POA: Insufficient documentation

## 2017-12-19 DIAGNOSIS — H40053 Ocular hypertension, bilateral: Secondary | ICD-10-CM | POA: Diagnosis present

## 2017-12-19 LAB — COMPREHENSIVE METABOLIC PANEL
ALBUMIN: 3.5 g/dL (ref 3.5–5.0)
ALK PHOS: 124 U/L (ref 38–126)
ALT: 12 U/L — ABNORMAL LOW (ref 14–54)
ANION GAP: 8 (ref 5–15)
AST: 15 U/L (ref 15–41)
BILIRUBIN TOTAL: 1.1 mg/dL (ref 0.3–1.2)
BUN: 11 mg/dL (ref 6–20)
CALCIUM: 9.1 mg/dL (ref 8.9–10.3)
CO2: 30 mmol/L (ref 22–32)
Chloride: 105 mmol/L (ref 101–111)
Creatinine, Ser: 0.86 mg/dL (ref 0.44–1.00)
GFR calc Af Amer: >60 mL/min (ref >60)
GFR calc non Af Amer: >60 mL/min (ref >60)
Glucose, Bld: 82 mg/dL (ref 65–99)
Potassium: 3.6 mmol/L (ref 3.5–5.1)
SODIUM: 143 mmol/L (ref 135–145)
TOTAL PROTEIN: 6.6 g/dL (ref 6.5–8.1)

## 2017-12-19 MED ORDER — SODIUM CHLORIDE 0.9 % IV SOLN
1000.0000 mg | Freq: Once | INTRAVENOUS | Status: AC
  Administered 2017-12-19: 1000 mg via INTRAVENOUS
  Filled 2017-12-19: qty 8

## 2017-12-26 ENCOUNTER — Ambulatory Visit
Admission: RE | Admit: 2017-12-26 | Discharge: 2017-12-26 | Disposition: A | Discharge status: Home / Self Care | Payer: Medicare Other | Source: Ambulatory Visit | Attending: Ophthalmology | Admitting: Ophthalmology

## 2017-12-26 DIAGNOSIS — E0500 Thyrotoxicosis with diffuse goiter without thyrotoxic crisis or storm: Secondary | ICD-10-CM | POA: Insufficient documentation

## 2017-12-26 DIAGNOSIS — H40053 Ocular hypertension, bilateral: Secondary | ICD-10-CM | POA: Insufficient documentation

## 2017-12-26 LAB — COMPREHENSIVE METABOLIC PANEL
ALK PHOS: 122 U/L (ref 38–126)
ALT: 12 U/L — AB (ref 14–54)
AST: 15 U/L (ref 15–41)
Albumin: 3.4 g/dL — ABNORMAL LOW (ref 3.5–5.0)
Anion gap: 6 (ref 5–15)
BILIRUBIN TOTAL: 1.1 mg/dL (ref 0.3–1.2)
BUN: 9 mg/dL (ref 6–20)
CALCIUM: 8.8 mg/dL — AB (ref 8.9–10.3)
CO2: 25 mmol/L (ref 22–32)
CREATININE: 0.7 mg/dL (ref 0.44–1.00)
Chloride: 109 mmol/L (ref 101–111)
GFR calc non Af Amer: >60 mL/min (ref >60)
Glucose, Bld: 96 mg/dL (ref 65–99)
Potassium: 3.6 mmol/L (ref 3.5–5.1)
SODIUM: 140 mmol/L (ref 135–145)
TOTAL PROTEIN: 6.1 g/dL — AB (ref 6.5–8.1)

## 2017-12-26 MED ORDER — SODIUM CHLORIDE 0.9 % IV SOLN
1000.0000 mg | Freq: Once | INTRAVENOUS | Status: AC
  Administered 2017-12-26: 1000 mg via INTRAVENOUS
  Filled 2017-12-26: qty 8

## 2018-01-02 ENCOUNTER — Ambulatory Visit
Admission: RE | Admit: 2018-01-02 | Discharge: 2018-01-02 | Disposition: A | Discharge status: Home / Self Care | Payer: Medicare Other | Source: Ambulatory Visit | Attending: Ophthalmology | Admitting: Ophthalmology

## 2018-01-02 DIAGNOSIS — H40053 Ocular hypertension, bilateral: Secondary | ICD-10-CM | POA: Diagnosis present

## 2018-01-02 DIAGNOSIS — E0500 Thyrotoxicosis with diffuse goiter without thyrotoxic crisis or storm: Secondary | ICD-10-CM | POA: Insufficient documentation

## 2018-01-02 LAB — COMPREHENSIVE METABOLIC PANEL
ALT: 13 U/L — ABNORMAL LOW (ref 14–54)
ANION GAP: 9 (ref 5–15)
AST: 14 U/L — ABNORMAL LOW (ref 15–41)
Albumin: 3.3 g/dL — ABNORMAL LOW (ref 3.5–5.0)
Alkaline Phosphatase: 116 U/L (ref 38–126)
BILIRUBIN TOTAL: 0.7 mg/dL (ref 0.3–1.2)
BUN: 9 mg/dL (ref 6–20)
CO2: 26 mmol/L (ref 22–32)
Calcium: 8.5 mg/dL — ABNORMAL LOW (ref 8.9–10.3)
Chloride: 107 mmol/L (ref 101–111)
Creatinine, Ser: 0.53 mg/dL (ref 0.44–1.00)
GFR calc Af Amer: >60 mL/min (ref >60)
GFR calc non Af Amer: >60 mL/min (ref >60)
Glucose, Bld: 96 mg/dL (ref 65–99)
POTASSIUM: 3.4 mmol/L — AB (ref 3.5–5.1)
Sodium: 142 mmol/L (ref 135–145)
TOTAL PROTEIN: 6 g/dL — AB (ref 6.5–8.1)

## 2018-01-02 MED ORDER — SODIUM CHLORIDE 0.9 % IV SOLN
1000.0000 mg | Freq: Once | INTRAVENOUS | Status: AC
  Administered 2018-01-02: 1000 mg via INTRAVENOUS
  Filled 2018-01-02: qty 8

## 2018-01-02 MED ORDER — SODIUM CHLORIDE 0.9 % IV SOLN
INTRAVENOUS | Status: DC
  Administered 2018-01-02: 13:00:00 via INTRAVENOUS

## 2018-01-02 NOTE — OR Nursing (Signed)
Patient educated on potassium rich foods, and given a copy of labs to share with PCP.

## 2018-01-02 NOTE — OR Nursing (Signed)
Spoke with Nevin Bloodgood at Dr Dingeldein's office, she has faxed results to Dr Rayfield Citizen.  I also left a message with Dr Rayfield Citizen regarding results.

## 2018-01-02 NOTE — OR Nursing (Signed)
Dr Rayfield Citizen returned call regarding labs, advised that patient was educated on potassium rich foods and given copy of labs to share with PCP. No new orders at this time.

## 2018-01-09 ENCOUNTER — Ambulatory Visit
Admission: RE | Admit: 2018-01-09 | Discharge: 2018-01-09 | Disposition: A | Discharge status: Home / Self Care | Payer: Medicare Other | Source: Ambulatory Visit | Attending: Ophthalmology | Admitting: Ophthalmology

## 2018-01-09 DIAGNOSIS — H40059 Ocular hypertension, unspecified eye: Secondary | ICD-10-CM | POA: Diagnosis not present

## 2018-01-09 DIAGNOSIS — E0500 Thyrotoxicosis with diffuse goiter without thyrotoxic crisis or storm: Secondary | ICD-10-CM | POA: Diagnosis not present

## 2018-01-09 LAB — COMPREHENSIVE METABOLIC PANEL
ALBUMIN: 3.1 g/dL — AB (ref 3.5–5.0)
ALK PHOS: 126 U/L (ref 38–126)
ALT: 12 U/L — ABNORMAL LOW (ref 14–54)
ANION GAP: 8 (ref 5–15)
AST: 16 U/L (ref 15–41)
BUN: 8 mg/dL (ref 6–20)
CO2: 25 mmol/L (ref 22–32)
Calcium: 8.4 mg/dL — ABNORMAL LOW (ref 8.9–10.3)
Chloride: 109 mmol/L (ref 101–111)
Creatinine, Ser: 0.67 mg/dL (ref 0.44–1.00)
GFR calc Af Amer: >60 mL/min (ref >60)
GFR calc non Af Amer: >60 mL/min (ref >60)
GLUCOSE: 107 mg/dL — AB (ref 65–99)
POTASSIUM: 3.5 mmol/L (ref 3.5–5.1)
Sodium: 142 mmol/L (ref 135–145)
Total Bilirubin: 0.6 mg/dL (ref 0.3–1.2)
Total Protein: 6 g/dL — ABNORMAL LOW (ref 6.5–8.1)

## 2018-01-09 MED ORDER — SODIUM CHLORIDE 0.9 % IV SOLN
500.0000 mg | Freq: Once | INTRAVENOUS | Status: AC
  Administered 2018-01-09: 500 mg via INTRAVENOUS
  Filled 2018-01-09: qty 4

## 2018-01-16 ENCOUNTER — Ambulatory Visit
Admission: RE | Admit: 2018-01-16 | Discharge: 2018-01-16 | Disposition: A | Discharge status: Home / Self Care | Payer: Medicare Other | Source: Ambulatory Visit | Attending: Ophthalmology | Admitting: Ophthalmology

## 2018-01-16 DIAGNOSIS — H40053 Ocular hypertension, bilateral: Secondary | ICD-10-CM | POA: Insufficient documentation

## 2018-01-16 DIAGNOSIS — E0500 Thyrotoxicosis with diffuse goiter without thyrotoxic crisis or storm: Secondary | ICD-10-CM | POA: Diagnosis not present

## 2018-01-16 LAB — COMPREHENSIVE METABOLIC PANEL
ALBUMIN: 3.2 g/dL — AB (ref 3.5–5.0)
ALT: 14 U/L (ref 14–54)
ANION GAP: 10 (ref 5–15)
AST: 20 U/L (ref 15–41)
Alkaline Phosphatase: 134 U/L — ABNORMAL HIGH (ref 38–126)
BUN: 7 mg/dL (ref 6–20)
CHLORIDE: 106 mmol/L (ref 101–111)
CO2: 23 mmol/L (ref 22–32)
Calcium: 8.7 mg/dL — ABNORMAL LOW (ref 8.9–10.3)
Creatinine, Ser: 0.76 mg/dL (ref 0.44–1.00)
GFR calc Af Amer: >60 mL/min (ref >60)
GFR calc non Af Amer: >60 mL/min (ref >60)
GLUCOSE: 126 mg/dL — AB (ref 65–99)
POTASSIUM: 3.5 mmol/L (ref 3.5–5.1)
SODIUM: 139 mmol/L (ref 135–145)
TOTAL PROTEIN: 6.4 g/dL — AB (ref 6.5–8.1)
Total Bilirubin: 0.6 mg/dL (ref 0.3–1.2)

## 2018-01-16 MED ORDER — SODIUM CHLORIDE 0.9 % IV SOLN
500.0000 mg | Freq: Once | INTRAVENOUS | Status: AC
  Administered 2018-01-16: 500 mg via INTRAVENOUS
  Filled 2018-01-16: qty 4

## 2018-01-23 ENCOUNTER — Ambulatory Visit
Admission: RE | Admit: 2018-01-23 | Discharge: 2018-01-23 | Disposition: A | Discharge status: Home / Self Care | Payer: Medicare Other | Source: Ambulatory Visit | Attending: Ophthalmology | Admitting: Ophthalmology

## 2018-01-23 DIAGNOSIS — E0500 Thyrotoxicosis with diffuse goiter without thyrotoxic crisis or storm: Secondary | ICD-10-CM | POA: Diagnosis not present

## 2018-01-23 DIAGNOSIS — H40053 Ocular hypertension, bilateral: Secondary | ICD-10-CM | POA: Diagnosis present

## 2018-01-23 LAB — COMPREHENSIVE METABOLIC PANEL
ALBUMIN: 3.6 g/dL (ref 3.5–5.0)
ALK PHOS: 136 U/L — AB (ref 38–126)
ALT: 15 U/L (ref 14–54)
ANION GAP: 10 (ref 5–15)
AST: 26 U/L (ref 15–41)
BILIRUBIN TOTAL: 1.2 mg/dL (ref 0.3–1.2)
BUN: 7 mg/dL (ref 6–20)
CALCIUM: 9 mg/dL (ref 8.9–10.3)
CO2: 25 mmol/L (ref 22–32)
Chloride: 104 mmol/L (ref 101–111)
Creatinine, Ser: 0.69 mg/dL (ref 0.44–1.00)
GFR calc Af Amer: >60 mL/min (ref >60)
GFR calc non Af Amer: >60 mL/min (ref >60)
GLUCOSE: 85 mg/dL (ref 65–99)
POTASSIUM: 3.9 mmol/L (ref 3.5–5.1)
Sodium: 139 mmol/L (ref 135–145)
Total Protein: 7.2 g/dL (ref 6.5–8.1)

## 2018-01-23 MED ORDER — SODIUM CHLORIDE 0.9 % IV SOLN
500.0000 mg | Freq: Once | INTRAVENOUS | Status: AC
  Administered 2018-01-23: 500 mg via INTRAVENOUS
  Filled 2018-01-23: qty 4

## 2018-01-30 ENCOUNTER — Ambulatory Visit
Admission: RE | Admit: 2018-01-30 | Discharge: 2018-01-30 | Disposition: A | Discharge status: Home / Self Care | Payer: Medicare Other | Source: Ambulatory Visit | Attending: Ophthalmology | Admitting: Ophthalmology

## 2018-01-30 DIAGNOSIS — E0500 Thyrotoxicosis with diffuse goiter without thyrotoxic crisis or storm: Secondary | ICD-10-CM | POA: Insufficient documentation

## 2018-01-30 DIAGNOSIS — H40059 Ocular hypertension, unspecified eye: Secondary | ICD-10-CM | POA: Diagnosis not present

## 2018-01-30 LAB — COMPREHENSIVE METABOLIC PANEL
ALT: 12 U/L — AB (ref 14–54)
AST: 15 U/L (ref 15–41)
Albumin: 3.1 g/dL — ABNORMAL LOW (ref 3.5–5.0)
Alkaline Phosphatase: 104 U/L (ref 38–126)
Anion gap: 10 (ref 5–15)
BILIRUBIN TOTAL: 0.9 mg/dL (ref 0.3–1.2)
BUN: 8 mg/dL (ref 6–20)
CALCIUM: 8.7 mg/dL — AB (ref 8.9–10.3)
CO2: 26 mmol/L (ref 22–32)
CREATININE: 0.69 mg/dL (ref 0.44–1.00)
Chloride: 104 mmol/L (ref 101–111)
GFR calc Af Amer: >60 mL/min (ref >60)
Glucose, Bld: 102 mg/dL — ABNORMAL HIGH (ref 65–99)
Potassium: 3.7 mmol/L (ref 3.5–5.1)
Sodium: 140 mmol/L (ref 135–145)
TOTAL PROTEIN: 6.2 g/dL — AB (ref 6.5–8.1)

## 2018-01-30 MED ORDER — SODIUM CHLORIDE 0.9 % IV SOLN
500.0000 mg | Freq: Once | INTRAVENOUS | Status: AC
  Administered 2018-01-30: 500 mg via INTRAVENOUS
  Filled 2018-01-30 (×2): qty 4

## 2018-01-30 NOTE — OR Nursing (Signed)
Lab results faxed to MD.

## 2018-02-06 ENCOUNTER — Ambulatory Visit
Admission: RE | Admit: 2018-02-06 | Discharge: 2018-02-06 | Disposition: A | Discharge status: Home / Self Care | Payer: Medicare Other | Source: Ambulatory Visit | Attending: Ophthalmology | Admitting: Ophthalmology

## 2018-02-06 DIAGNOSIS — E0500 Thyrotoxicosis with diffuse goiter without thyrotoxic crisis or storm: Secondary | ICD-10-CM | POA: Diagnosis not present

## 2018-02-06 DIAGNOSIS — H40053 Ocular hypertension, bilateral: Secondary | ICD-10-CM | POA: Insufficient documentation

## 2018-02-06 LAB — COMPREHENSIVE METABOLIC PANEL
ALK PHOS: 114 U/L (ref 38–126)
ALT: 11 U/L — ABNORMAL LOW (ref 14–54)
ANION GAP: 10 (ref 5–15)
AST: 20 U/L (ref 15–41)
Albumin: 3.2 g/dL — ABNORMAL LOW (ref 3.5–5.0)
BILIRUBIN TOTAL: 0.9 mg/dL (ref 0.3–1.2)
BUN: 8 mg/dL (ref 6–20)
CALCIUM: 8.7 mg/dL — AB (ref 8.9–10.3)
CO2: 24 mmol/L (ref 22–32)
Chloride: 105 mmol/L (ref 101–111)
Creatinine, Ser: 0.71 mg/dL (ref 0.44–1.00)
GFR calc Af Amer: >60 mL/min (ref >60)
GFR calc non Af Amer: >60 mL/min (ref >60)
Glucose, Bld: 97 mg/dL (ref 65–99)
Potassium: 3.6 mmol/L (ref 3.5–5.1)
SODIUM: 139 mmol/L (ref 135–145)
TOTAL PROTEIN: 6.5 g/dL (ref 6.5–8.1)

## 2018-02-06 MED ORDER — SODIUM CHLORIDE 0.9 % IV SOLN
500.0000 mg | Freq: Once | INTRAVENOUS | Status: AC
  Administered 2018-02-06: 500 mg via INTRAVENOUS
  Filled 2018-02-06: qty 4

## 2018-02-06 MED ORDER — SODIUM CHLORIDE 0.9 % IV SOLN
500.0000 mg | Freq: Once | INTRAVENOUS | Status: AC
  Administered 2018-02-13: 500 mg via INTRAVENOUS
  Filled 2018-02-06 (×2): qty 4

## 2018-02-13 ENCOUNTER — Ambulatory Visit
Admission: RE | Admit: 2018-02-13 | Discharge: 2018-02-13 | Disposition: A | Discharge status: Home / Self Care | Payer: Medicare Other | Source: Ambulatory Visit | Attending: Ophthalmology | Admitting: Ophthalmology

## 2018-02-13 DIAGNOSIS — H40053 Ocular hypertension, bilateral: Secondary | ICD-10-CM | POA: Diagnosis present

## 2018-02-13 DIAGNOSIS — E0500 Thyrotoxicosis with diffuse goiter without thyrotoxic crisis or storm: Secondary | ICD-10-CM | POA: Diagnosis not present

## 2018-02-13 LAB — COMPREHENSIVE METABOLIC PANEL
ALK PHOS: 112 U/L (ref 38–126)
ALT: 12 U/L — ABNORMAL LOW (ref 14–54)
AST: 17 U/L (ref 15–41)
Albumin: 3.2 g/dL — ABNORMAL LOW (ref 3.5–5.0)
Anion gap: 8 (ref 5–15)
BILIRUBIN TOTAL: 0.8 mg/dL (ref 0.3–1.2)
BUN: 8 mg/dL (ref 6–20)
CO2: 25 mmol/L (ref 22–32)
Calcium: 8.9 mg/dL (ref 8.9–10.3)
Chloride: 108 mmol/L (ref 101–111)
Creatinine, Ser: 0.59 mg/dL (ref 0.44–1.00)
GFR calc Af Amer: >60 mL/min (ref >60)
GFR calc non Af Amer: >60 mL/min (ref >60)
GLUCOSE: 91 mg/dL (ref 65–99)
POTASSIUM: 3.8 mmol/L (ref 3.5–5.1)
SODIUM: 141 mmol/L (ref 135–145)
TOTAL PROTEIN: 6.2 g/dL — AB (ref 6.5–8.1)

## 2018-02-20 ENCOUNTER — Ambulatory Visit
Admission: RE | Admit: 2018-02-20 | Discharge: 2018-02-20 | Disposition: A | Discharge status: Home / Self Care | Payer: Medicare Other | Source: Ambulatory Visit | Attending: Ophthalmology | Admitting: Ophthalmology

## 2018-02-20 DIAGNOSIS — H468 Other optic neuritis: Secondary | ICD-10-CM | POA: Diagnosis not present

## 2018-02-20 DIAGNOSIS — E0500 Thyrotoxicosis with diffuse goiter without thyrotoxic crisis or storm: Secondary | ICD-10-CM | POA: Diagnosis present

## 2018-02-20 MED ORDER — SODIUM CHLORIDE 0.9 % IV SOLN
250.0000 mg | Freq: Once | INTRAVENOUS | Status: AC
  Administered 2018-02-20: 250 mg via INTRAVENOUS
  Filled 2018-02-20: qty 2

## 2018-02-27 ENCOUNTER — Ambulatory Visit
Admission: RE | Admit: 2018-02-27 | Discharge: 2018-02-27 | Disposition: A | Discharge status: Home / Self Care | Payer: Medicare Other | Source: Ambulatory Visit | Attending: Ophthalmology | Admitting: Ophthalmology

## 2018-02-27 DIAGNOSIS — H40053 Ocular hypertension, bilateral: Secondary | ICD-10-CM | POA: Diagnosis not present

## 2018-02-27 DIAGNOSIS — E0500 Thyrotoxicosis with diffuse goiter without thyrotoxic crisis or storm: Secondary | ICD-10-CM | POA: Insufficient documentation

## 2018-02-27 MED ORDER — SODIUM CHLORIDE 0.9 % IV SOLN
250.0000 mg | Freq: Once | INTRAVENOUS | Status: AC
  Administered 2018-02-27: 250 mg via INTRAVENOUS
  Filled 2018-02-27: qty 250

## 2018-03-05 ENCOUNTER — Other Ambulatory Visit: Payer: Self-pay | Admitting: Internal Medicine

## 2018-03-05 DIAGNOSIS — Z1231 Encounter for screening mammogram for malignant neoplasm of breast: Secondary | ICD-10-CM

## 2018-03-06 ENCOUNTER — Other Ambulatory Visit: Payer: Self-pay | Admitting: Internal Medicine

## 2018-03-06 ENCOUNTER — Ambulatory Visit
Admission: RE | Admit: 2018-03-06 | Discharge: 2018-03-06 | Disposition: A | Discharge status: Home / Self Care | Payer: Medicare Other | Source: Ambulatory Visit | Attending: Ophthalmology | Admitting: Ophthalmology

## 2018-03-06 DIAGNOSIS — E0500 Thyrotoxicosis with diffuse goiter without thyrotoxic crisis or storm: Secondary | ICD-10-CM | POA: Diagnosis not present

## 2018-03-06 DIAGNOSIS — Z853 Personal history of malignant neoplasm of breast: Secondary | ICD-10-CM

## 2018-03-06 MED ORDER — METHYLPREDNISOLONE SODIUM SUCC 500 MG IJ SOLR
250.0000 mg | Freq: Once | INTRAMUSCULAR | Status: AC
  Administered 2018-03-06: 250 mg via INTRAVENOUS
  Filled 2018-03-06: qty 250

## 2018-03-31 ENCOUNTER — Other Ambulatory Visit: Payer: Self-pay

## 2018-03-31 ENCOUNTER — Encounter: Payer: Self-pay | Admitting: *Deleted

## 2018-03-31 ENCOUNTER — Other Ambulatory Visit: Payer: Medicare Other

## 2018-04-01 NOTE — Discharge Instructions (Signed)

## 2018-04-06 ENCOUNTER — Encounter
Admission: RE | Disposition: A | Discharge status: Home / Self Care | Payer: Self-pay | Source: Ambulatory Visit | Attending: Ophthalmology

## 2018-04-06 ENCOUNTER — Ambulatory Visit
Admission: RE | Admit: 2018-04-06 | Discharge: 2018-04-06 | Disposition: A | Discharge status: Home / Self Care | Payer: Medicare Other | Source: Ambulatory Visit | Attending: Ophthalmology | Admitting: Ophthalmology

## 2018-04-06 ENCOUNTER — Ambulatory Visit: Payer: Medicare Other | Admitting: Anesthesiology

## 2018-04-06 DIAGNOSIS — E7800 Pure hypercholesterolemia, unspecified: Secondary | ICD-10-CM | POA: Diagnosis not present

## 2018-04-06 DIAGNOSIS — I10 Essential (primary) hypertension: Secondary | ICD-10-CM | POA: Insufficient documentation

## 2018-04-06 DIAGNOSIS — Z79890 Hormone replacement therapy (postmenopausal): Secondary | ICD-10-CM | POA: Insufficient documentation

## 2018-04-06 DIAGNOSIS — Z79899 Other long term (current) drug therapy: Secondary | ICD-10-CM | POA: Diagnosis not present

## 2018-04-06 DIAGNOSIS — H2589 Other age-related cataract: Secondary | ICD-10-CM | POA: Diagnosis not present

## 2018-04-06 DIAGNOSIS — E890 Postprocedural hypothyroidism: Secondary | ICD-10-CM | POA: Diagnosis not present

## 2018-04-06 DIAGNOSIS — Z6834 Body mass index (BMI) 34.0-34.9, adult: Secondary | ICD-10-CM | POA: Insufficient documentation

## 2018-04-06 HISTORY — PX: CATARACT EXTRACTION W/PHACO: SHX586

## 2018-04-06 HISTORY — DX: Presence of dental prosthetic device (complete) (partial): Z97.2

## 2018-04-06 HISTORY — DX: Cardiac murmur, unspecified: R01.1

## 2018-04-06 HISTORY — DX: Hypothyroidism, unspecified: E03.9

## 2018-04-06 SURGERY — PHACOEMULSIFICATION, CATARACT, WITH IOL INSERTION
Anesthesia: Monitor Anesthesia Care | Site: Eye | Laterality: Left | Wound class: "Clean "

## 2018-04-06 MED ORDER — NA HYALUR & NA CHOND-NA HYALUR 0.4-0.35 ML IO KIT
PACK | INTRAOCULAR | Status: DC | PRN
  Administered 2018-04-06: 1 mL via INTRAOCULAR

## 2018-04-06 MED ORDER — TRYPAN BLUE 0.06 % OP SOLN
OPHTHALMIC | Status: DC | PRN
  Administered 2018-04-06: 0.5 mL via INTRAOCULAR

## 2018-04-06 MED ORDER — BALANCED SALT IO SOLN
INTRAOCULAR | Status: DC | PRN
  Administered 2018-04-06: 1 mL

## 2018-04-06 MED ORDER — MOXIFLOXACIN HCL 0.5 % OP SOLN
1.0000 [drp] | OPHTHALMIC | Status: DC | PRN
Start: 2018-04-06 — End: 2018-04-06
  Administered 2018-04-06 (×3): 1 [drp] via OPHTHALMIC

## 2018-04-06 MED ORDER — FENTANYL CITRATE (PF) 100 MCG/2ML IJ SOLN
INTRAMUSCULAR | Status: DC | PRN
  Administered 2018-04-06: 50 ug via INTRAVENOUS

## 2018-04-06 MED ORDER — BRIMONIDINE TARTRATE-TIMOLOL 0.2-0.5 % OP SOLN
OPHTHALMIC | Status: DC | PRN
  Administered 2018-04-06: 1 [drp] via OPHTHALMIC

## 2018-04-06 MED ORDER — MIDAZOLAM HCL 2 MG/2ML IJ SOLN
INTRAMUSCULAR | Status: DC | PRN
  Administered 2018-04-06: 2 mg via INTRAVENOUS

## 2018-04-06 MED ORDER — SODIUM HYALURONATE 23 MG/ML IO SOLN
INTRAOCULAR | Status: DC | PRN
  Administered 2018-04-06: 0.6 mL via INTRAOCULAR

## 2018-04-06 MED ORDER — ARMC OPHTHALMIC DILATING DROPS
1.0000 "application " | OPHTHALMIC | Status: DC | PRN
  Administered 2018-04-06 (×3): 1 via OPHTHALMIC

## 2018-04-06 MED ORDER — OXYCODONE HCL 5 MG PO TABS: 5.0000 mg | ORAL_TABLET | Freq: Once | ORAL | Status: DC | PRN

## 2018-04-06 MED ORDER — CEFUROXIME OPHTHALMIC INJECTION 1 MG/0.1 ML
INJECTION | OPHTHALMIC | Status: DC | PRN
  Administered 2018-04-06: 0.1 mL via INTRACAMERAL

## 2018-04-06 MED ORDER — LACTATED RINGERS IV SOLN: INTRAVENOUS | Status: DC

## 2018-04-06 MED ORDER — OXYCODONE HCL 5 MG/5ML PO SOLN: 5.0000 mg | Freq: Once | ORAL | Status: DC | PRN

## 2018-04-06 MED ORDER — EPINEPHRINE PF 1 MG/ML IJ SOLN
INTRAOCULAR | Status: DC | PRN
  Administered 2018-04-06: 100 mL via OPHTHALMIC

## 2018-04-06 SURGICAL SUPPLY — 23 items
CANNULA ANT/CHMB 27G (MISCELLANEOUS) ×1 IMPLANT
CANNULA ANT/CHMB 27GA (MISCELLANEOUS) ×2 IMPLANT
GLOVE SURG LX 7.5 STRW (GLOVE) ×1
GLOVE SURG LX STRL 7.5 STRW (GLOVE) ×1 IMPLANT
GLOVE SURG TRIUMPH 8.0 PF LTX (GLOVE) ×2 IMPLANT
GOWN STRL REUS W/ TWL LRG LVL3 (GOWN DISPOSABLE) ×2 IMPLANT
GOWN STRL REUS W/TWL LRG LVL3 (GOWN DISPOSABLE) ×2
LENS IOL TECNIS ITEC 24.5 (Intraocular Lens) ×1 IMPLANT
MARKER SKIN DUAL TIP RULER LAB (MISCELLANEOUS) ×2 IMPLANT
NDL FILTER BLUNT 18X1 1/2 (NEEDLE) ×1 IMPLANT
NEEDLE FILTER BLUNT 18X 1/2SAF (NEEDLE) ×1
NEEDLE FILTER BLUNT 18X1 1/2 (NEEDLE) ×1 IMPLANT
PACK CATARACT BRASINGTON (MISCELLANEOUS) ×2 IMPLANT
PACK EYE AFTER SURG (MISCELLANEOUS) ×2 IMPLANT
RING MALYGIN (MISCELLANEOUS) ×1 IMPLANT
SPONGE SURG I SPEAR (MISCELLANEOUS) ×1 IMPLANT
SUT ETHILON 10-0 CS-B-6CS-B-6 (SUTURE) ×4
SUTURE EHLN 10-0 CS-B-6CS-B-6 (SUTURE) IMPLANT
SYR 3ML LL SCALE MARK (SYRINGE) ×2 IMPLANT
SYR 5ML LL (SYRINGE) ×2 IMPLANT
SYR TB 1ML LUER SLIP (SYRINGE) ×2 IMPLANT
WATER STERILE IRR 500ML POUR (IV SOLUTION) ×2 IMPLANT
WIPE NON LINTING 3.25X3.25 (MISCELLANEOUS) ×2 IMPLANT

## 2018-04-06 NOTE — Transfer of Care (Signed)
Immediate Anesthesia Transfer of Care Note  Patient: Cindy [name]  Procedure(s) Performed: CATARACT EXTRACTION PHACO AND INTRAOCULAR LENS PLACEMENT (IOC) LEFT COMPLICATED (Left Eye)  Patient Location: PACU  Anesthesia Type: MAC  Level of Consciousness: awake, alert  and patient cooperative  Airway and Oxygen Therapy: Patient Spontanous Breathing and Patient connected to supplemental oxygen  Post-op Assessment: Post-op Vital signs reviewed, Patient's Cardiovascular Status Stable, Respiratory Function Stable, Patent Airway and No signs of Nausea or vomiting  Post-op Vital Signs: Reviewed and stable  Complications: No apparent anesthesia complications

## 2018-04-06 NOTE — Op Note (Signed)
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  Vision Blue dye was used to stain the lens capsule.  LENS:   Implant Name Type Inv. Item Serial No. Manufacturer Lot No. LRB No. Used  LENS IOL DIOP 24.5 - [id number] Intraocular Lens LENS IOL DIOP 24.5 [id number]  Left 1       ULTRASOUND TIME: 16 of 1 minutes 123 seconds, CDE 13.6  SURGEON:  Wyonia Hough, MD   ANESTHESIA:  Topical with tetracaine drops and 2% Xylocaine jelly, augmented with 1% preservative-free intracameral lidocaine.   COMPLICATIONS:  None.   DESCRIPTION OF PROCEDURE:  The patient was identified in the holding room and transported to the operating room and placed in the supine position under the operating microscope. Theleft eye was identified as the operative eye and it was prepped and draped in the usual sterile ophthalmic fashion.  A 1 millimeter clear-corneal paracentesis was made at the 1:30 position.  0.5 ml of preservative-free 1% lidocaine was injected into the anterior chamber. The anterior chamber was filled with Healon 5 viscoelastic.  A 2.4 millimeter keratome was used to make a near-clear corneal incision at the 10:30 position.  The anterior chamber was filled with Healon 5 viscoelastic.  Vision Blue dye was then injected under the viscoelastic to stain the lens capsule.  BSS was then used to wash the dye out.  Additional Healon 5 was placed into the anterior chamber.  A curvilinear capsulorrhexis was made with a cystotome and capsulorrhexis forceps.  Balanced salt solution was used to hydrodissect and hydrodelineate the nucleus.  Additional Healon 5 and a Malyugin ring were placed because of significant posterior pressure and iris prolapse during  hydrodissection.  Viscoat was then placed in the anterior chamber.   Phacoemulsification was then used in stop and chop fashion to remove the lens nucleus and epinucleus. A radial tear of the capsule was noted, but did not extend to the posterior capsule. The remaining cortex was then removed using the irrigation and aspiration handpiece. Provisc was then placed into the capsular bag to distend it for lens placement.  A 24.5 -diopter lens was then injected into the capsular bag.  The Malyugin ring was removed. The remaining viscoelastic was aspirated.   Wounds were hydrated with balanced salt solution.  Four interupted 10-0 nylon sutures were required to close the main incision to ensure a watertight closure. The anterior chamber was inflated to a physiologic pressure with balanced salt solution. Cefuroxime 0.1 ml of a 10mg /ml solution was injected into the anterior chamber for a dose of 1 mg of intracameral antibiotic at the completion of the case.  No wound leaks were noted.  Topical Combigan drops were applied to the eye.  The patient was taken to the recovery room in stable condition without complications of anesthesia or surgery.  Agostino Gorin 04/06/2018, 12:45 PM

## 2018-04-06 NOTE — Anesthesia Preprocedure Evaluation (Addendum)
Anesthesia Evaluation  Patient identified by MRN, date of birth, ID band  Reviewed: NPO status   History of Anesthesia Complications Negative for: history of anesthetic complications  Airway Mallampati: II  TM Distance: >3 FB Neck ROM: full    Dental no notable dental hx. (+) Upper Dentures   Pulmonary neg pulmonary ROS,    Pulmonary exam normal        Cardiovascular Exercise Tolerance: Good hypertension, Normal cardiovascular exam     Neuro/Psych glaucoma negative psych ROS   GI/Hepatic negative GI ROS, Neg liver ROS,   Endo/Other  Hypothyroidism Morbid obesity (bmi=34)  Renal/GU negative Renal ROS  negative genitourinary   Musculoskeletal  (+) Arthritis ,   Abdominal   Peds  Hematology R Breast Cancer : 2013    Anesthesia Other Findings Cards to f/u with pt regarding echo and stress. Pt denies CP or SOB. Good Ex. Tol. Pt and family ok to proceed with surgery today.  Reproductive/Obstetrics                            Anesthesia Physical Anesthesia Plan  ASA: II  Anesthesia Plan: MAC   Post-op Pain Management:    Induction:   PONV Risk Score and Plan:   Airway Management Planned:   Additional Equipment:   Intra-op Plan:   Post-operative Plan:   Informed Consent: I have reviewed the patients History and Physical, chart, labs and discussed the procedure including the risks, benefits and alternatives for the proposed anesthesia with the patient or authorized representative who has indicated his/her understanding and acceptance.     Plan Discussed with: CRNA  Anesthesia Plan Comments:        Anesthesia Quick Evaluation

## 2018-04-06 NOTE — Anesthesia Procedure Notes (Signed)
Procedure Name: MAC Date/Time: 04/06/2018 11:54 AM Performed by: Janna Arch, CRNA Pre-anesthesia Checklist: Emergency Drugs available, Patient identified, Suction available and Patient being monitored Patient Re-evaluated:Patient Re-evaluated prior to induction Oxygen Delivery Method: Nasal cannula

## 2018-04-06 NOTE — Anesthesia Postprocedure Evaluation (Signed)
Anesthesia Post Note  Patient: Cindy [name]  Procedure(s) Performed: CATARACT EXTRACTION PHACO AND INTRAOCULAR LENS PLACEMENT (IOC) LEFT COMPLICATED (Left Eye)  Patient location during evaluation: PACU Anesthesia Type: MAC Level of consciousness: awake and alert Pain management: pain level controlled Vital Signs Assessment: post-procedure vital signs reviewed and stable Respiratory status: spontaneous breathing, nonlabored ventilation, respiratory function stable and patient connected to nasal cannula oxygen Cardiovascular status: stable and blood pressure returned to baseline Postop Assessment: no apparent nausea or vomiting Anesthetic complications: no    Abdirahman Chittum

## 2018-04-06 NOTE — H&P (Signed)
The History and Physical notes are on paper, have been signed, and are to be scanned. The patient remains stable and unchanged from the H&P.   Previous H&P reviewed, patient examined, and there are no changes.  Cindy [name] 04/06/2018 11:33 AM

## 2018-04-07 ENCOUNTER — Encounter: Payer: Self-pay | Admitting: Ophthalmology

## 2018-04-17 ENCOUNTER — Ambulatory Visit
Admission: RE | Admit: 2018-04-17 | Discharge: 2018-04-17 | Disposition: A | Discharge status: Home / Self Care | Payer: Medicare Other | Source: Ambulatory Visit | Attending: Internal Medicine | Admitting: Internal Medicine

## 2018-04-17 DIAGNOSIS — Z853 Personal history of malignant neoplasm of breast: Secondary | ICD-10-CM | POA: Diagnosis not present

## 2018-06-23 ENCOUNTER — Other Ambulatory Visit: Payer: Self-pay

## 2018-06-23 ENCOUNTER — Encounter: Payer: Self-pay | Admitting: *Deleted

## 2018-06-26 NOTE — Discharge Instructions (Signed)

## 2018-06-30 ENCOUNTER — Ambulatory Visit
Admission: RE | Admit: 2018-06-30 | Discharge: 2018-06-30 | Disposition: A | Discharge status: Home / Self Care | Payer: Medicare Other | Source: Ambulatory Visit | Attending: Ophthalmology | Admitting: Ophthalmology

## 2018-06-30 ENCOUNTER — Ambulatory Visit: Payer: Medicare Other | Admitting: Anesthesiology

## 2018-06-30 ENCOUNTER — Encounter
Admission: RE | Disposition: A | Discharge status: Home / Self Care | Payer: Self-pay | Source: Ambulatory Visit | Attending: Ophthalmology

## 2018-06-30 DIAGNOSIS — I10 Essential (primary) hypertension: Secondary | ICD-10-CM | POA: Diagnosis not present

## 2018-06-30 DIAGNOSIS — E7800 Pure hypercholesterolemia, unspecified: Secondary | ICD-10-CM | POA: Diagnosis not present

## 2018-06-30 DIAGNOSIS — E039 Hypothyroidism, unspecified: Secondary | ICD-10-CM | POA: Diagnosis not present

## 2018-06-30 DIAGNOSIS — Z853 Personal history of malignant neoplasm of breast: Secondary | ICD-10-CM | POA: Diagnosis not present

## 2018-06-30 DIAGNOSIS — H5703 Miosis: Secondary | ICD-10-CM | POA: Diagnosis not present

## 2018-06-30 DIAGNOSIS — H2511 Age-related nuclear cataract, right eye: Secondary | ICD-10-CM | POA: Insufficient documentation

## 2018-06-30 DIAGNOSIS — Z79899 Other long term (current) drug therapy: Secondary | ICD-10-CM | POA: Diagnosis not present

## 2018-06-30 HISTORY — PX: CATARACT EXTRACTION W/PHACO: SHX586

## 2018-06-30 SURGERY — PHACOEMULSIFICATION, CATARACT, WITH IOL INSERTION
Anesthesia: Monitor Anesthesia Care | Site: Eye | Laterality: Right | Wound class: "Clean "

## 2018-06-30 MED ORDER — MIDAZOLAM HCL 2 MG/2ML IJ SOLN
INTRAMUSCULAR | Status: DC | PRN
  Administered 2018-06-30: 1 mg via INTRAVENOUS

## 2018-06-30 MED ORDER — CEFUROXIME OPHTHALMIC INJECTION 1 MG/0.1 ML
INJECTION | OPHTHALMIC | Status: DC | PRN
  Administered 2018-06-30: 0.1 mL via INTRACAMERAL

## 2018-06-30 MED ORDER — MOXIFLOXACIN HCL 0.5 % OP SOLN
1.0000 [drp] | OPHTHALMIC | Status: DC | PRN
  Administered 2018-06-30 (×3): 1 [drp] via OPHTHALMIC

## 2018-06-30 MED ORDER — NA HYALUR & NA CHOND-NA HYALUR 0.4-0.35 ML IO KIT
PACK | INTRAOCULAR | Status: DC | PRN
  Administered 2018-06-30: 1 mL via INTRAOCULAR

## 2018-06-30 MED ORDER — LACTATED RINGERS IV SOLN: INTRAVENOUS | Status: DC

## 2018-06-30 MED ORDER — EPINEPHRINE PF 1 MG/ML IJ SOLN
INTRAOCULAR | Status: DC | PRN
  Administered 2018-06-30: 55 mL via OPHTHALMIC

## 2018-06-30 MED ORDER — LIDOCAINE HCL (PF) 2 % IJ SOLN
INTRAOCULAR | Status: DC | PRN
  Administered 2018-06-30: 1 mL

## 2018-06-30 MED ORDER — BRIMONIDINE TARTRATE-TIMOLOL 0.2-0.5 % OP SOLN
OPHTHALMIC | Status: DC | PRN
  Administered 2018-06-30: 1 [drp] via OPHTHALMIC

## 2018-06-30 MED ORDER — ARMC OPHTHALMIC DILATING DROPS
1.0000 "application " | OPHTHALMIC | Status: DC | PRN
  Administered 2018-06-30 (×3): 1 via OPHTHALMIC

## 2018-06-30 MED ORDER — FENTANYL CITRATE (PF) 100 MCG/2ML IJ SOLN
INTRAMUSCULAR | Status: DC | PRN
  Administered 2018-06-30: 50 ug via INTRAVENOUS

## 2018-06-30 SURGICAL SUPPLY — 23 items
CANNULA ANT/CHMB 27G (MISCELLANEOUS) ×1 IMPLANT
CANNULA ANT/CHMB 27GA (MISCELLANEOUS) ×2 IMPLANT
GLOVE SURG LX 7.5 STRW (GLOVE) ×1
GLOVE SURG LX STRL 7.5 STRW (GLOVE) ×1 IMPLANT
GLOVE SURG TRIUMPH 8.0 PF LTX (GLOVE) ×2 IMPLANT
GOWN STRL REUS W/ TWL LRG LVL3 (GOWN DISPOSABLE) ×2 IMPLANT
GOWN STRL REUS W/TWL LRG LVL3 (GOWN DISPOSABLE) ×2
LENS IOL TECNIS ITEC 26.0 (Intraocular Lens) ×1 IMPLANT
MARKER SKIN DUAL TIP RULER LAB (MISCELLANEOUS) ×2 IMPLANT
NDL FILTER BLUNT 18X1 1/2 (NEEDLE) ×1 IMPLANT
NEEDLE FILTER BLUNT 18X 1/2SAF (NEEDLE) ×1
NEEDLE FILTER BLUNT 18X1 1/2 (NEEDLE) ×1 IMPLANT
PACK CATARACT BRASINGTON (MISCELLANEOUS) ×2 IMPLANT
PACK EYE AFTER SURG (MISCELLANEOUS) ×2 IMPLANT
PACK OPTHALMIC (MISCELLANEOUS) ×2 IMPLANT
RING MALYGIN 7.0 (MISCELLANEOUS) ×1 IMPLANT
SUT ETHILON 10-0 CS-B-6CS-B-6 (SUTURE) ×2
SUTURE EHLN 10-0 CS-B-6CS-B-6 (SUTURE) IMPLANT
SYR 3ML LL SCALE MARK (SYRINGE) ×2 IMPLANT
SYR 5ML LL (SYRINGE) ×2 IMPLANT
SYR TB 1ML LUER SLIP (SYRINGE) ×2 IMPLANT
WATER STERILE IRR 500ML POUR (IV SOLUTION) ×2 IMPLANT
WIPE NON LINTING 3.25X3.25 (MISCELLANEOUS) ×2 IMPLANT

## 2018-06-30 NOTE — H&P (Signed)
The History and Physical notes are on paper, have been signed, and are to be scanned. The patient remains stable and unchanged from the H&P.   Previous H&P reviewed, patient examined, and there are no changes.  Cindy [name] 06/30/2018 11:07 AM

## 2018-06-30 NOTE — Transfer of Care (Signed)
Immediate Anesthesia Transfer of Care Note  Patient: EESHA SCHMALTZ  Procedure(s) Performed: CATARACT EXTRACTION PHACO AND INTRAOCULAR LENS PLACEMENT (IOC) COMPLICATED   (MALYUGIN)  RIGHT (Right Eye)  Patient Location: PACU  Anesthesia Type: MAC  Level of Consciousness: awake, alert  and patient cooperative  Airway and Oxygen Therapy: Patient Spontanous Breathing and Patient connected to supplemental oxygen  Post-op Assessment: Post-op Vital signs reviewed, Patient's Cardiovascular Status Stable, Respiratory Function Stable, Patent Airway and No signs of Nausea or vomiting  Post-op Vital Signs: Reviewed and stable  Complications: No apparent anesthesia complications

## 2018-06-30 NOTE — Anesthesia Postprocedure Evaluation (Signed)
Anesthesia Post Note  Patient: Cindy [name]  Procedure(s) Performed: CATARACT EXTRACTION PHACO AND INTRAOCULAR LENS PLACEMENT ([place]) COMPLICATED   (MALYUGIN)  RIGHT (Right Eye)  Patient location during evaluation: PACU Anesthesia Type: MAC Level of consciousness: awake and alert Pain management: pain level controlled Vital Signs Assessment: post-procedure vital signs reviewed and stable Respiratory status: spontaneous breathing Cardiovascular status: blood pressure returned to baseline Postop Assessment: no headache Anesthetic complications: no    Jaci Standard, III,  Wyllow Seigler D

## 2018-06-30 NOTE — Op Note (Signed)
OPERATIVE NOTE  Cindy [name] [id number] 06/30/2018   PREOPERATIVE DIAGNOSIS:    Nuclear Sclerotic Cataract Right eye with miotic pupil.        H25.11  POSTOPERATIVE DIAGNOSIS: Nuclear Sclerotic Cataract Right eye with miotic pupil.          PROCEDURE:  Phacoemusification with posterior chamber intraocular lens placement of the right eye which required pupil stretching with the Malyugin pupil expansion device.  LENS:   Implant Name Type Inv. Item Serial No. Manufacturer Lot No. LRB No. Used  LENS IOL DIOP 26.0 - [id number] Intraocular Lens LENS IOL DIOP 26.0 [id number]  Right 1       ULTRASOUND TIME: 14 % of 0 minutes 41 seconds, CDE 5.7  SURGEON:  Wyonia Hough, MD   ANESTHESIA:  Topical with tetracaine drops and 2% Xylocaine jelly, augmented with 1% preservative-free intracameral lidocaine.   COMPLICATIONS:  None.   DESCRIPTION OF PROCEDURE:  The patient was identified in the holding room and transported to the operating room and placed in the supine position under the operating microscope. Theright eye was identified as the operative eye and it was prepped and draped in the usual sterile ophthalmic fashion.   A 1 millimeter clear-corneal paracentesis was made at the 12:00 position.  0.5 ml of preservative-free 1% lidocaine was injected into the anterior chamber. The anterior chamber was filled with Viscoat viscoelastic.  A 2.4 millimeter keratome was used to make a near-clear corneal incision at the 9:00 position. A Malyugin pupil expander was then placed through the main incision and into the anterior chamber of the eye.  The edge of the iris was secured on the lip of the pupil expander and it was released, thereby expanding the pupil to approximately 7 millimeters for completion of the cataract surgery.  Additional Viscoat was placed in the anterior chamber.  A cystotome and capsulorrhexis forceps were used to make a curvilinear capsulorrhexis.   Balanced salt solution  was used to hydrodissect and hydrodelineate the lens nucleus.   Phacoemulsification was used in stop and chop fashion to remove the lens, nucleus and epinucleus.  The remaining cortex was aspirated using the irrigation aspiration handpiece.  Additional Provisc was placed into the eye to distend the capsular bag for lens placement.  A lens was then injected into the capsular bag.  The pupil expanding ring was removed using a Kuglen hook and insertion device. The remaining viscoelastic was aspirated from the capsular bag and the anterior chamber.  The anterior chamber was filled with balanced salt solution to inflate to a physiologic pressure.  Wounds were hydrated with balanced salt solution.  The anterior chamber was inflated to a physiologic pressure with balanced salt solution.  A 10-0 nylon suture was placed through the 1 mm paracentesis incision. No wound leaks were noted.Cefuroxime 0.1 ml of a 10mg /ml solution was injected into the anterior chamber for a dose of 1 mg of intracameral antibiotic at the completion of the case. Timolol and Brimonidine drops were applied to the eye.  The patient was taken to the recovery room in stable condition without complications of anesthesia or surgery.  Cindy [name] 06/30/2018, 11:53 AM

## 2018-06-30 NOTE — Anesthesia Preprocedure Evaluation (Signed)
Anesthesia Evaluation  Patient identified by MRN, date of birth, ID band Patient awake    Reviewed: Allergy & Precautions, H&P , NPO status , Patient's Chart, lab work & pertinent test results  Airway Mallampati: II  TM Distance: >3 FB Neck ROM: full    Dental no notable dental hx.    Pulmonary neg pulmonary ROS,    Pulmonary exam normal        Cardiovascular hypertension, Normal cardiovascular exam     Neuro/Psych    GI/Hepatic negative GI ROS, Neg liver ROS,   Endo/Other  Hypothyroidism   Renal/GU negative Renal ROS     Musculoskeletal   Abdominal   Peds  Hematology negative hematology ROS (+)   Anesthesia Other Findings   Reproductive/Obstetrics negative OB ROS                             Anesthesia Physical Anesthesia Plan  ASA: II  Anesthesia Plan: MAC   Post-op Pain Management:    Induction:   PONV Risk Score and Plan:   Airway Management Planned:   Additional Equipment:   Intra-op Plan:   Post-operative Plan:   Informed Consent: I have reviewed the patients History and Physical, chart, labs and discussed the procedure including the risks, benefits and alternatives for the proposed anesthesia with the patient or authorized representative who has indicated his/her understanding and acceptance.     Plan Discussed with:   Anesthesia Plan Comments:         Anesthesia Quick Evaluation

## 2018-06-30 NOTE — Anesthesia Procedure Notes (Signed)
Procedure Name: MAC Date/Time: 06/30/2018 11:30 AM Performed by: Lind Guest, CRNA Pre-anesthesia Checklist: Patient identified, Emergency Drugs available, Suction available, Patient being monitored and Timeout performed Patient Re-evaluated:Patient Re-evaluated prior to induction Oxygen Delivery Method: Nasal cannula

## 2018-07-01 ENCOUNTER — Encounter: Payer: Self-pay | Admitting: Ophthalmology

## 2018-09-15 ENCOUNTER — Ambulatory Visit: Payer: Medicare Other | Admitting: Oncology

## 2018-09-20 NOTE — Progress Notes (Signed)
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  She currently feels well and is asymptomatic. She has no neurologic complaints.  She denies any recent fevers or illnesses.  She denies any chest pain or shortness of breath. She has a good appetite and denies weight loss. She has no nausea, vomiting, constipation, or diarrhea.  She has no urinary complaints.  Patient feels at her baseline offers no specific complaints today.  REVIEW OF SYSTEMS:   Review of Systems  Constitutional: Negative.  Negative for fever, malaise/fatigue and weight loss.  HENT: Negative.   Respiratory: Negative.  Negative for cough and shortness of breath.   Cardiovascular: Negative.  Negative for chest pain and leg swelling.  Gastrointestinal: Negative.  Negative for abdominal pain.  Genitourinary: Negative.  Negative for dysuria.  Musculoskeletal: Negative.  Negative for joint pain.  Skin: Negative.  Negative for rash.  Neurological: Negative.  Negative for sensory change, focal weakness, weakness and headaches.  Psychiatric/Behavioral: Negative.  The patient is not nervous/anxious.     As per HPI. Otherwise, a complete review of systems is negative.  PAST MEDICAL HISTORY: Past Medical History:  Diagnosis Date  . Breast cancer ([place]) 2013   right breast cancer lumpectomy and rad tx  . Cancer ([place])    breast  . Heart murmur    followed by PCP  . Hyperlipidemia   . Hypertension   . Hypothyroidism   . Wears dentures    partial upper    PAST SURGICAL HISTORY: Past Surgical History:  Procedure Laterality Date  . BREAST EXCISIONAL BIOPSY Right 2013   +  . BREAST LUMPECTOMY Right 04/20/2012   re excision 05/26/2012.  Radiation tx  . BREAST SURGERY Right 04/20/2012   lumpectomy  . CATARACT EXTRACTION W/PHACO Left 04/06/2018   Procedure: CATARACT EXTRACTION PHACO AND INTRAOCULAR LENS PLACEMENT ([place]) LEFT COMPLICATED;  Surgeon: Leandrew Koyanagi, MD;  Location: [place];  Service: Ophthalmology;  Laterality: Left;  HEALON 5 VISION BLUE  . CATARACT EXTRACTION W/PHACO Right 06/30/2018   Procedure: CATARACT EXTRACTION PHACO AND INTRAOCULAR LENS PLACEMENT ([place]) COMPLICATED   (MALYUGIN)  RIGHT;  Surgeon: Leandrew Koyanagi, MD;  Location: [facility];  Service: Ophthalmology;  Laterality: Right;  . COLONOSCOPY    . EYE SURGERY    . THYROIDECTOMY  08/2017   [facility] - [place]    FAMILY HISTORY Family History  Problem Relation Age of Onset  . Breast cancer Cousin   . Breast cancer Mother        51's  . Breast cancer Maternal Aunt        60's       ADVANCED DIRECTIVES:    HEALTH MAINTENANCE: Social History   Tobacco Use  . Smoking status: Never Smoker  . Smokeless tobacco: Never Used  Substance Use Topics  . Alcohol use: No    Alcohol/week: 0.0 standard drinks  . Drug use: No     Colonoscopy:  PAP:  Bone density:  Lipid panel:  Allergies  Allergen Reactions  . Niacin Rash    Hives    Current Outpatient Medications  Medication Sig Dispense Refill  . acetaminophen (TYLENOL) 500 MG tablet Take 500 mg  by mouth every 6 (six) hours as needed.    Marland Kitchen amLODipine (NORVASC) 5 MG tablet daily.  6  . atorvastatin (LIPITOR) 20 MG tablet Take 20 mg by mouth daily.     . bimatoprost (LUMIGAN) 0.03 % ophthalmic solution Place 1 drop into both eyes at bedtime.    . brimonidine-timolol (COMBIGAN) 0.2-0.5 % ophthalmic solution Place 1 drop into both eyes every 12 (twelve) hours.    . brinzolamide (AZOPT) 1 % ophthalmic suspension Place 1 drop into both eyes 2 (two) times daily.    . furosemide (LASIX) 20 MG tablet  Take 20 mg by mouth.    . levothyroxine (SYNTHROID, LEVOTHROID) 100 MCG tablet Take 88 mcg by mouth daily before breakfast.     . metoprolol tartrate (LOPRESSOR) 25 MG tablet TAKE 1/2 TABLET BY MOUTH ONCE DAILY. PT PREFERES [phone]  3  . Vitamin D, Ergocalciferol, (DRISDOL) 50000 units CAPS capsule TAKE ONE CAPSULE ONCE WEEKLY  2  . calcium carbonate (TUMS - DOSED IN MG ELEMENTAL CALCIUM) 500 MG chewable tablet Chew 1 tablet by mouth daily.    . Calcium Carbonate-Vitamin D (CALTRATE 600+D PO) Take by mouth daily.    . methotrexate (RHEUMATREX) 15 MG tablet Take 15 mg by mouth once a week. Caution: Chemotherapy. Protect from light.    . metoprolol tartrate (LOPRESSOR) 25 MG tablet Take 25 mg by mouth.     No current facility-administered medications for this visit.     OBJECTIVE: Vitals:   09/24/18 0950  BP: 114/76  Pulse: 72  Resp: 18  Temp: (!) 97.2 F (36.2 C)     Body mass index is 36.46 kg/m.    ECOG FS:0 - Asymptomatic  General: Well-developed, well-nourished, no acute distress. Eyes: Pink conjunctiva, anicteric sclera. HEENT: Normocephalic, moist mucous membranes, clear oropharnyx. Breast: Patient reports a recent normal breast exam by another provider. Lungs: Clear to auscultation bilaterally. Heart: Regular rate and rhythm. No rubs, murmurs, or gallops. Abdomen: Soft, nontender, nondistended. No organomegaly noted, normoactive bowel sounds. Musculoskeletal: No edema, cyanosis, or clubbing. Neuro: Alert, answering all questions appropriately. Cranial nerves grossly intact. Skin: No rashes or petechiae noted. Psych: Normal affect.  LAB RESULTS:  Lab Results  Component Value Date   NA 141 02/13/2018   K 3.8 02/13/2018   CL 108 02/13/2018   CO2 25 02/13/2018   GLUCOSE 91 02/13/2018   BUN 8 02/13/2018   CREATININE 0.59 02/13/2018   CALCIUM 8.9 02/13/2018   PROT 6.2 (L) 02/13/2018   ALBUMIN 3.2 (L) 02/13/2018   AST 17 02/13/2018   ALT 12 (L) 02/13/2018    ALKPHOS 112 02/13/2018   BILITOT 0.8 02/13/2018   GFRNONAA >60 02/13/2018   GFRAA >60 02/13/2018    Lab Results  Component Value Date   WBC 7.6 07/16/2017   NEUTROABS 5.2 07/16/2017   HGB 12.1 07/16/2017   HCT 35.9 07/16/2017   MCV 88.5 07/16/2017   PLT 277 07/16/2017     STUDIES: No results found.  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  PLAN:    1.  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  Previously, patient declined adjuvant chemotherapy and only received treatment with adjuvant XRT followed by an aromatase inhibitor for 5 years. Patient completed  5 years of letrozole in September 2018.  Patient's most recent mammogram on April 17, 2018 was reported as BI-RADS 2.  Repeat in April 2020.  After discussion with the patient, she states that she has "too many physician appointments" and expressed the desire that her primary care physician continue to monitor her mammograms now that she has completed letrozole.  Her mammogram scheduled for April 2020 has been ordered, but no further follow-up has been scheduled.  Please refer patient back if there are any questions or concerns.   2. Postmenopausal: Bone mineral density completed on July 29, 2017 had a reported T score of -0.5. This is considered normal. Repeat in July 2020.  Bone mineral densities can now be ordered by patient's primary care physician. 3. Joint Pain: Patient does not complain of this today. 4. Thyroid: Continue evaluation and treatment per [facility] endocrinology.   I spent a total of 30 minutes face-to-face with the patient of which greater than 50% of the visit was spent in counseling and coordination of care as detailed above.   Patient  expressed understanding and was in agreement with this plan. She also understands that She can call clinic at any time with any questions, concerns, or complaints.   Breast cancer   Staging form: Breast, AJCC 7th Edition     Clinical stage from 07/30/2015: Stage IA (T1c, N0, M0) - Signed by Lloyd Huger, MD on 07/30/2015   Lloyd Huger, MD   09/27/2018 9:26 AM

## 2018-09-24 ENCOUNTER — Other Ambulatory Visit: Payer: Self-pay

## 2018-09-24 ENCOUNTER — Inpatient Hospital Stay: Payer: Medicare Other | Attending: Oncology | Admitting: Oncology

## 2018-09-24 ENCOUNTER — Encounter: Payer: Self-pay | Admitting: Oncology

## 2018-09-24 VITALS — BP 114/76 | HR 72 | Temp 97.2°F | Resp 18 | Wt 205.8 lb

## 2018-09-24 DIAGNOSIS — Z853 Personal history of malignant neoplasm of breast: Secondary | ICD-10-CM | POA: Diagnosis not present

## 2018-09-24 DIAGNOSIS — Z170 Estrogen receptor positive status [ER+]: Secondary | ICD-10-CM

## 2018-09-24 DIAGNOSIS — C50411 Malignant neoplasm of upper-outer quadrant of right female breast: Secondary | ICD-10-CM

## 2018-09-24 NOTE — Progress Notes (Signed)
Patient here for follow up. Pt states feeling fine.

## 2019-05-03 ENCOUNTER — Other Ambulatory Visit: Payer: Self-pay | Admitting: Internal Medicine

## 2019-05-03 ENCOUNTER — Other Ambulatory Visit (HOSPITAL_COMMUNITY): Payer: Self-pay | Admitting: Internal Medicine

## 2019-05-03 DIAGNOSIS — R7989 Other specified abnormal findings of blood chemistry: Secondary | ICD-10-CM

## 2019-06-01 ENCOUNTER — Other Ambulatory Visit: Payer: Self-pay | Admitting: Internal Medicine

## 2019-06-01 DIAGNOSIS — R7989 Other specified abnormal findings of blood chemistry: Secondary | ICD-10-CM

## 2019-06-01 DIAGNOSIS — R945 Abnormal results of liver function studies: Secondary | ICD-10-CM

## 2019-06-02 ENCOUNTER — Other Ambulatory Visit: Payer: Self-pay

## 2019-06-02 ENCOUNTER — Ambulatory Visit
Admission: RE | Admit: 2019-06-02 | Discharge: 2019-06-02 | Disposition: A | Discharge status: Home / Self Care | Payer: Medicare Other | Source: Ambulatory Visit | Attending: Internal Medicine | Admitting: Internal Medicine

## 2019-06-02 DIAGNOSIS — R945 Abnormal results of liver function studies: Secondary | ICD-10-CM

## 2019-06-02 DIAGNOSIS — R7989 Other specified abnormal findings of blood chemistry: Secondary | ICD-10-CM

## 2019-06-30 ENCOUNTER — Other Ambulatory Visit: Payer: Self-pay

## 2019-06-30 ENCOUNTER — Ambulatory Visit
Admission: RE | Admit: 2019-06-30 | Discharge: 2019-06-30 | Disposition: A | Discharge status: Home / Self Care | Payer: Medicare Other | Source: Ambulatory Visit | Attending: Oncology | Admitting: Oncology

## 2019-06-30 DIAGNOSIS — Z1231 Encounter for screening mammogram for malignant neoplasm of breast: Secondary | ICD-10-CM | POA: Insufficient documentation

## 2019-06-30 DIAGNOSIS — C50411 Malignant neoplasm of upper-outer quadrant of right female breast: Secondary | ICD-10-CM

## 2019-06-30 DIAGNOSIS — Z853 Personal history of malignant neoplasm of breast: Secondary | ICD-10-CM | POA: Diagnosis not present

## 2019-06-30 DIAGNOSIS — Z170 Estrogen receptor positive status [ER+]: Secondary | ICD-10-CM

## 2019-07-06 IMAGING — MG MM DIGITAL DIAGNOSTIC BILAT W/ TOMO W/ CAD
6 of 9 series · 6 of 25 positions shown · non-contrast
Comparison: Previous exam(s).

CLINICAL DATA: History of a right lumpectomy, including a
reexcision, for breast carcinoma in 4165.No current breast
complaints.

EXAM:
DIGITAL DIAGNOSTIC BILATERAL MAMMOGRAM WITH CAD AND TOMO

[R MLO]
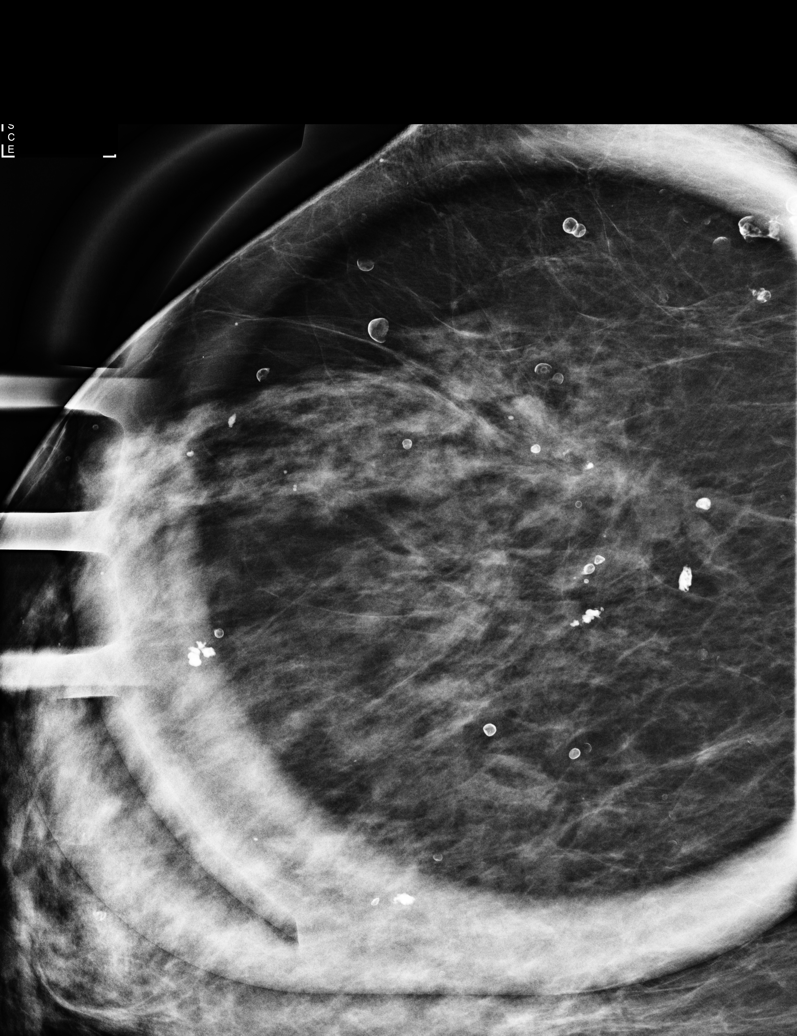

[L MLO synth-2D]
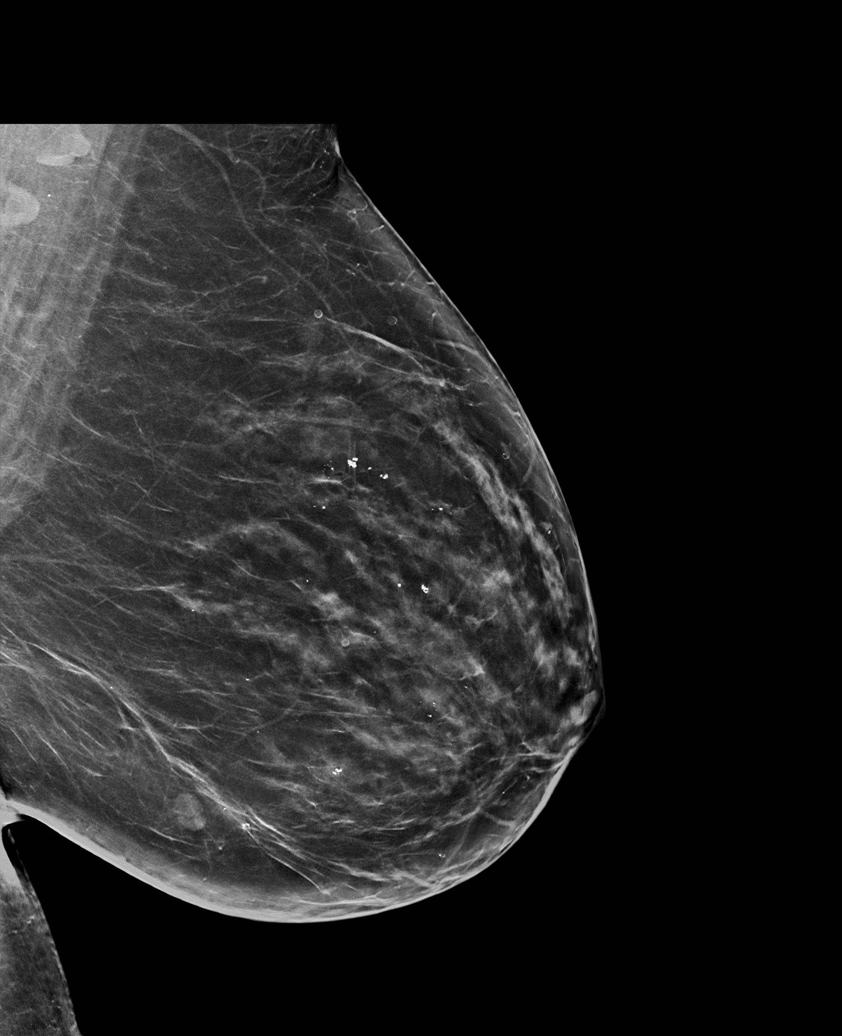

[R MLO synth-2D]
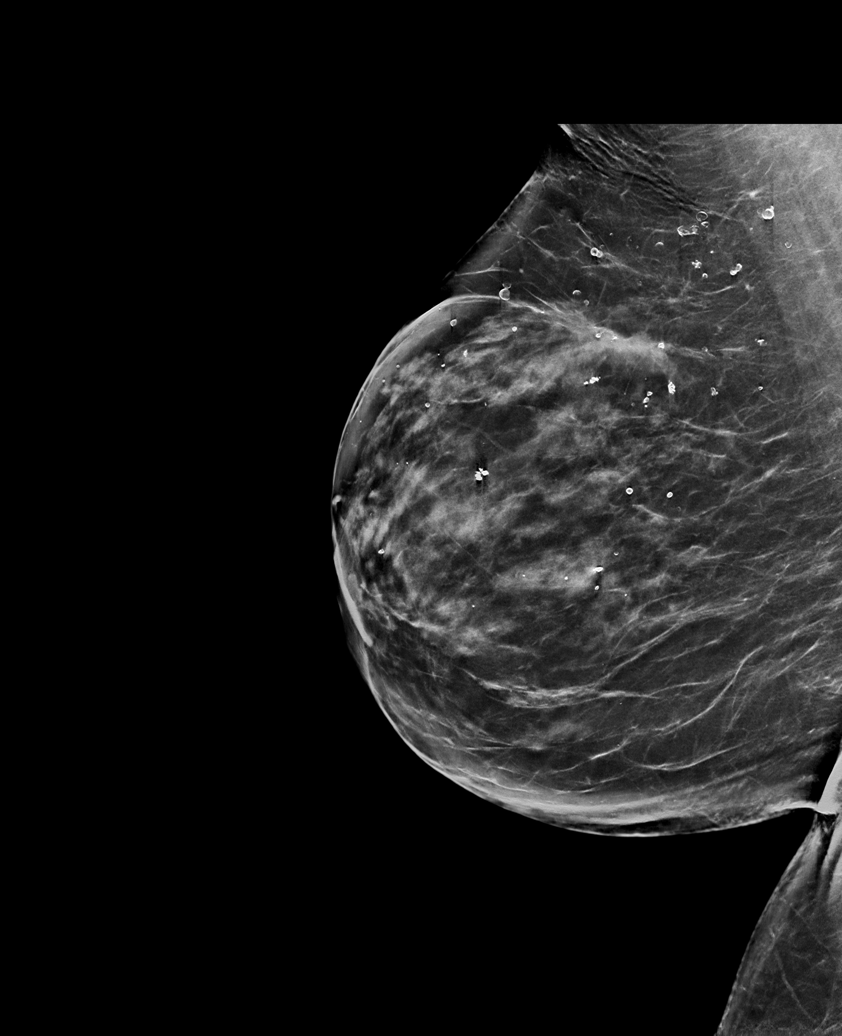

[R CC synth-2D]
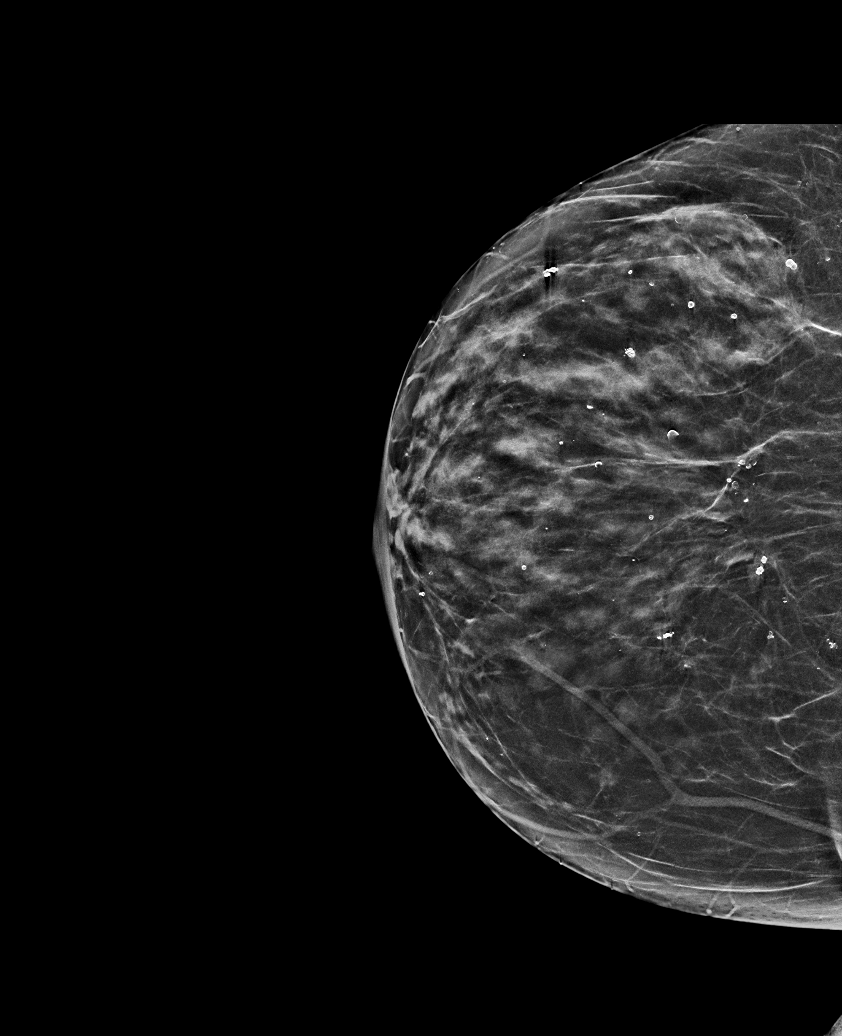

[L CC synth-2D]
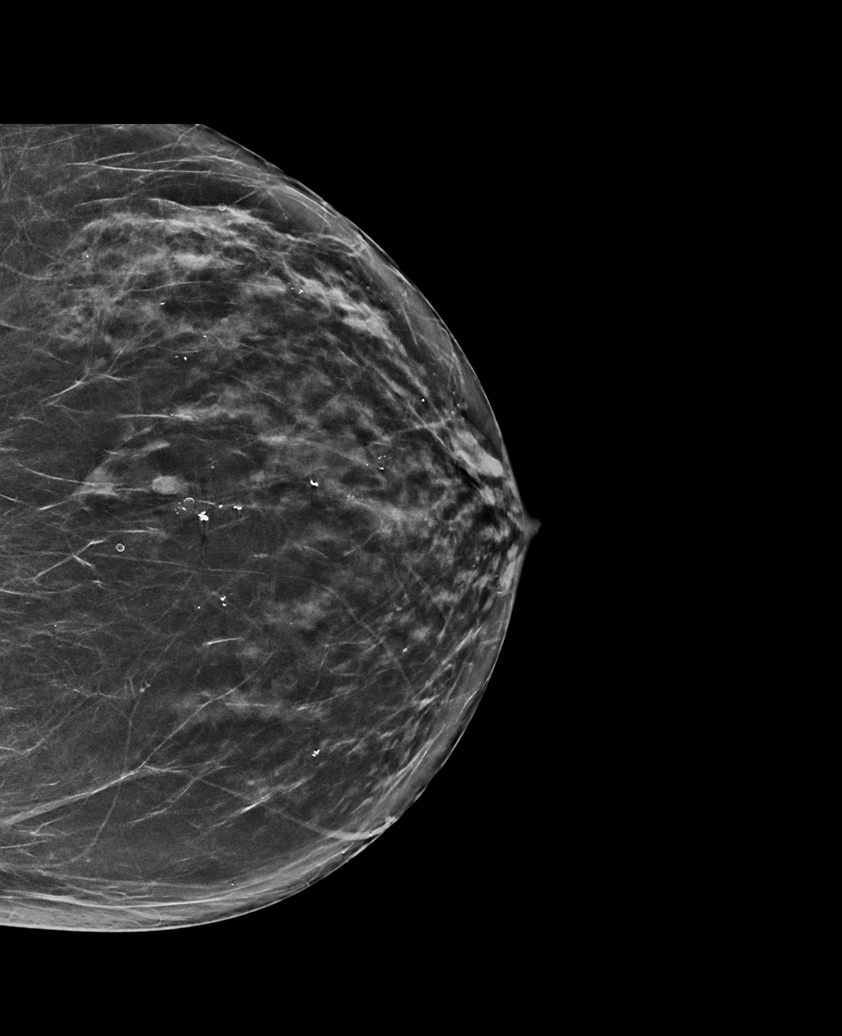

[L CC tomo · tomo slice 33/66.0]
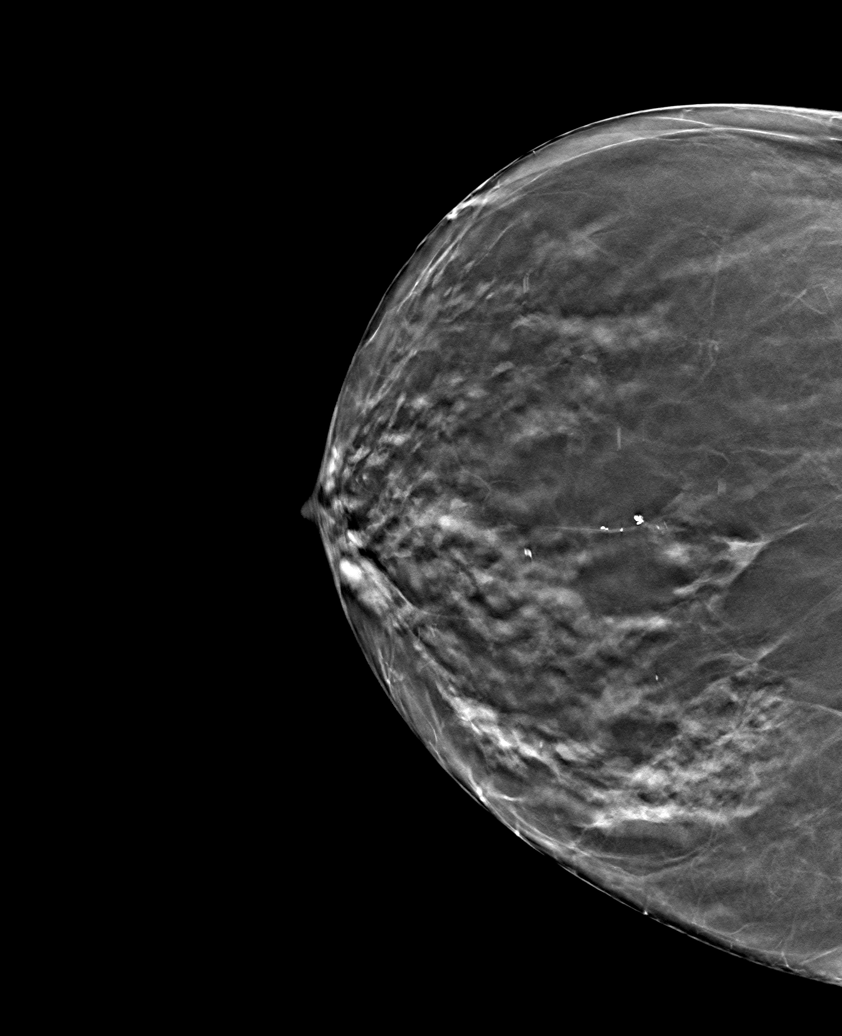

[6 of 25 positions shown; findings below may reference images not displayed]

ACR Breast Density Category c: The breast tissue is heterogeneously
dense, which may obscure small masses.
FINDINGS: Focal density with architectural distortion in the upper right
breast reflecting post surgical scarring is stable.

There are no discrete masses or other areas of architectural
distortion. There are no suspicious calcifications.

Mammographic images were processed with CAD.
IMPRESSION: 1. No evidence of recurrent or new breast malignancy.
2. Benign postsurgical changes on the right.

RECOMMENDATION:
Screening mammogram in one year.(Code:[id number]-G-CI5)

I have discussed the findings and recommendations with the patient.
Results were also provided in writing at the conclusion of the
visit. If applicable, a reminder letter will be sent to the patient
regarding the next appointment.

BI-RADS CATEGORY  2: Benign.

## 2019-12-06 ENCOUNTER — Other Ambulatory Visit: Payer: Self-pay

## 2019-12-06 DIAGNOSIS — Z20822 Contact with and (suspected) exposure to covid-19: Secondary | ICD-10-CM

## 2019-12-08 LAB — NOVEL CORONAVIRUS, NAA: SARS-CoV-2, NAA: NOT DETECTED

## 2020-06-01 ENCOUNTER — Other Ambulatory Visit: Payer: Self-pay | Admitting: Internal Medicine

## 2020-06-01 DIAGNOSIS — Z1231 Encounter for screening mammogram for malignant neoplasm of breast: Secondary | ICD-10-CM

## 2020-07-05 ENCOUNTER — Ambulatory Visit
Admission: RE | Admit: 2020-07-05 | Discharge: 2020-07-05 | Disposition: A | Discharge status: Home / Self Care | Payer: Medicare Other | Source: Ambulatory Visit | Attending: Internal Medicine | Admitting: Internal Medicine

## 2020-07-05 DIAGNOSIS — Z1231 Encounter for screening mammogram for malignant neoplasm of breast: Secondary | ICD-10-CM | POA: Insufficient documentation

## 2021-02-06 ENCOUNTER — Other Ambulatory Visit: Payer: Self-pay | Admitting: Internal Medicine

## 2021-02-06 DIAGNOSIS — M25511 Pain in right shoulder: Secondary | ICD-10-CM

## 2021-02-20 ENCOUNTER — Other Ambulatory Visit: Payer: Medicare Other

## 2021-04-05 DIAGNOSIS — M25511 Pain in right shoulder: Secondary | ICD-10-CM | POA: Diagnosis not present

## 2021-04-06 DIAGNOSIS — R7303 Prediabetes: Secondary | ICD-10-CM | POA: Diagnosis not present

## 2021-04-06 DIAGNOSIS — I10 Essential (primary) hypertension: Secondary | ICD-10-CM | POA: Diagnosis not present

## 2021-04-06 DIAGNOSIS — E782 Mixed hyperlipidemia: Secondary | ICD-10-CM | POA: Diagnosis not present

## 2021-04-06 DIAGNOSIS — E559 Vitamin D deficiency, unspecified: Secondary | ICD-10-CM | POA: Diagnosis not present

## 2021-04-06 DIAGNOSIS — D519 Vitamin B12 deficiency anemia, unspecified: Secondary | ICD-10-CM | POA: Diagnosis not present

## 2021-04-06 DIAGNOSIS — E039 Hypothyroidism, unspecified: Secondary | ICD-10-CM | POA: Diagnosis not present

## 2021-04-13 DIAGNOSIS — M25511 Pain in right shoulder: Secondary | ICD-10-CM | POA: Diagnosis not present

## 2021-04-18 DIAGNOSIS — M25511 Pain in right shoulder: Secondary | ICD-10-CM | POA: Diagnosis not present

## 2021-04-20 DIAGNOSIS — M25511 Pain in right shoulder: Secondary | ICD-10-CM | POA: Diagnosis not present

## 2021-04-25 DIAGNOSIS — M25511 Pain in right shoulder: Secondary | ICD-10-CM | POA: Diagnosis not present

## 2021-07-04 DIAGNOSIS — E039 Hypothyroidism, unspecified: Secondary | ICD-10-CM | POA: Diagnosis not present

## 2021-08-06 DIAGNOSIS — E559 Vitamin D deficiency, unspecified: Secondary | ICD-10-CM | POA: Diagnosis not present

## 2021-08-06 DIAGNOSIS — I10 Essential (primary) hypertension: Secondary | ICD-10-CM | POA: Diagnosis not present

## 2021-08-06 DIAGNOSIS — R7303 Prediabetes: Secondary | ICD-10-CM | POA: Diagnosis not present

## 2021-08-06 DIAGNOSIS — E782 Mixed hyperlipidemia: Secondary | ICD-10-CM | POA: Diagnosis not present

## 2021-08-06 DIAGNOSIS — E538 Deficiency of other specified B group vitamins: Secondary | ICD-10-CM | POA: Diagnosis not present

## 2021-08-06 DIAGNOSIS — E039 Hypothyroidism, unspecified: Secondary | ICD-10-CM | POA: Diagnosis not present

## 2021-08-09 ENCOUNTER — Other Ambulatory Visit: Payer: Self-pay | Admitting: Family

## 2021-08-09 ENCOUNTER — Other Ambulatory Visit: Payer: Self-pay | Admitting: Internal Medicine

## 2021-08-09 DIAGNOSIS — Z1231 Encounter for screening mammogram for malignant neoplasm of breast: Secondary | ICD-10-CM

## 2021-08-24 ENCOUNTER — Ambulatory Visit
Admission: RE | Admit: 2021-08-24 | Discharge: 2021-08-24 | Disposition: A | Discharge status: Home / Self Care | Payer: Medicare Other | Source: Ambulatory Visit | Attending: Family | Admitting: Family

## 2021-08-24 ENCOUNTER — Other Ambulatory Visit: Payer: Self-pay

## 2021-08-24 DIAGNOSIS — Z1231 Encounter for screening mammogram for malignant neoplasm of breast: Secondary | ICD-10-CM | POA: Insufficient documentation

## 2021-09-06 DIAGNOSIS — I10 Essential (primary) hypertension: Secondary | ICD-10-CM | POA: Diagnosis not present

## 2021-09-06 DIAGNOSIS — R0602 Shortness of breath: Secondary | ICD-10-CM | POA: Diagnosis not present

## 2021-09-06 DIAGNOSIS — E782 Mixed hyperlipidemia: Secondary | ICD-10-CM | POA: Diagnosis not present

## 2021-09-06 DIAGNOSIS — I2510 Atherosclerotic heart disease of native coronary artery without angina pectoris: Secondary | ICD-10-CM | POA: Diagnosis not present

## 2021-09-24 DIAGNOSIS — H40053 Ocular hypertension, bilateral: Secondary | ICD-10-CM | POA: Diagnosis not present

## 2021-09-26 DIAGNOSIS — R0602 Shortness of breath: Secondary | ICD-10-CM | POA: Diagnosis not present

## 2021-10-12 DIAGNOSIS — I2510 Atherosclerotic heart disease of native coronary artery without angina pectoris: Secondary | ICD-10-CM | POA: Diagnosis not present

## 2021-10-12 DIAGNOSIS — I10 Essential (primary) hypertension: Secondary | ICD-10-CM | POA: Diagnosis not present

## 2021-10-12 DIAGNOSIS — R609 Edema, unspecified: Secondary | ICD-10-CM | POA: Diagnosis not present

## 2021-10-12 DIAGNOSIS — E782 Mixed hyperlipidemia: Secondary | ICD-10-CM | POA: Diagnosis not present

## 2021-10-12 DIAGNOSIS — I340 Nonrheumatic mitral (valve) insufficiency: Secondary | ICD-10-CM | POA: Diagnosis not present

## 2021-11-20 DIAGNOSIS — I340 Nonrheumatic mitral (valve) insufficiency: Secondary | ICD-10-CM | POA: Diagnosis not present

## 2021-11-20 DIAGNOSIS — E782 Mixed hyperlipidemia: Secondary | ICD-10-CM | POA: Diagnosis not present

## 2021-11-20 DIAGNOSIS — I10 Essential (primary) hypertension: Secondary | ICD-10-CM | POA: Diagnosis not present

## 2021-11-20 DIAGNOSIS — R609 Edema, unspecified: Secondary | ICD-10-CM | POA: Diagnosis not present

## 2021-11-20 DIAGNOSIS — I2510 Atherosclerotic heart disease of native coronary artery without angina pectoris: Secondary | ICD-10-CM | POA: Diagnosis not present

## 2021-12-06 DIAGNOSIS — I10 Essential (primary) hypertension: Secondary | ICD-10-CM | POA: Diagnosis not present

## 2021-12-06 DIAGNOSIS — E782 Mixed hyperlipidemia: Secondary | ICD-10-CM | POA: Diagnosis not present

## 2021-12-06 DIAGNOSIS — E538 Deficiency of other specified B group vitamins: Secondary | ICD-10-CM | POA: Diagnosis not present

## 2021-12-06 DIAGNOSIS — E0500 Thyrotoxicosis with diffuse goiter without thyrotoxic crisis or storm: Secondary | ICD-10-CM | POA: Diagnosis not present

## 2021-12-06 DIAGNOSIS — R7303 Prediabetes: Secondary | ICD-10-CM | POA: Diagnosis not present

## 2022-01-04 DIAGNOSIS — E039 Hypothyroidism, unspecified: Secondary | ICD-10-CM | POA: Diagnosis not present

## 2022-03-14 DIAGNOSIS — E782 Mixed hyperlipidemia: Secondary | ICD-10-CM | POA: Diagnosis not present

## 2022-03-14 DIAGNOSIS — I340 Nonrheumatic mitral (valve) insufficiency: Secondary | ICD-10-CM | POA: Diagnosis not present

## 2022-03-14 DIAGNOSIS — R609 Edema, unspecified: Secondary | ICD-10-CM | POA: Diagnosis not present

## 2022-03-14 DIAGNOSIS — I10 Essential (primary) hypertension: Secondary | ICD-10-CM | POA: Diagnosis not present

## 2022-03-28 DIAGNOSIS — H40003 Preglaucoma, unspecified, bilateral: Secondary | ICD-10-CM | POA: Diagnosis not present

## 2022-03-28 DIAGNOSIS — H26492 Other secondary cataract, left eye: Secondary | ICD-10-CM | POA: Diagnosis not present

## 2022-05-14 DIAGNOSIS — M8589 Other specified disorders of bone density and structure, multiple sites: Secondary | ICD-10-CM | POA: Diagnosis not present

## 2022-05-14 DIAGNOSIS — I10 Essential (primary) hypertension: Secondary | ICD-10-CM | POA: Diagnosis not present

## 2022-05-14 DIAGNOSIS — E0500 Thyrotoxicosis with diffuse goiter without thyrotoxic crisis or storm: Secondary | ICD-10-CM | POA: Diagnosis not present

## 2022-05-14 DIAGNOSIS — E559 Vitamin D deficiency, unspecified: Secondary | ICD-10-CM | POA: Diagnosis not present

## 2022-05-14 DIAGNOSIS — E782 Mixed hyperlipidemia: Secondary | ICD-10-CM | POA: Diagnosis not present

## 2022-05-14 DIAGNOSIS — E538 Deficiency of other specified B group vitamins: Secondary | ICD-10-CM | POA: Diagnosis not present

## 2022-07-15 DIAGNOSIS — E039 Hypothyroidism, unspecified: Secondary | ICD-10-CM | POA: Diagnosis not present

## 2022-07-26 ENCOUNTER — Other Ambulatory Visit: Payer: Self-pay | Admitting: Internal Medicine

## 2022-07-26 DIAGNOSIS — Z1231 Encounter for screening mammogram for malignant neoplasm of breast: Secondary | ICD-10-CM

## 2022-08-13 DIAGNOSIS — R609 Edema, unspecified: Secondary | ICD-10-CM | POA: Diagnosis not present

## 2022-08-13 DIAGNOSIS — I340 Nonrheumatic mitral (valve) insufficiency: Secondary | ICD-10-CM | POA: Diagnosis not present

## 2022-08-13 DIAGNOSIS — I10 Essential (primary) hypertension: Secondary | ICD-10-CM | POA: Diagnosis not present

## 2022-08-13 DIAGNOSIS — E782 Mixed hyperlipidemia: Secondary | ICD-10-CM | POA: Diagnosis not present

## 2022-08-13 DIAGNOSIS — I2510 Atherosclerotic heart disease of native coronary artery without angina pectoris: Secondary | ICD-10-CM | POA: Diagnosis not present

## 2022-08-27 ENCOUNTER — Ambulatory Visit
Admission: RE | Admit: 2022-08-27 | Discharge: 2022-08-27 | Disposition: A | Discharge status: Home / Self Care | Payer: Medicare Other | Source: Ambulatory Visit | Attending: Internal Medicine | Admitting: Internal Medicine

## 2022-08-27 DIAGNOSIS — Z1231 Encounter for screening mammogram for malignant neoplasm of breast: Secondary | ICD-10-CM | POA: Insufficient documentation

## 2022-10-07 DIAGNOSIS — H40003 Preglaucoma, unspecified, bilateral: Secondary | ICD-10-CM | POA: Diagnosis not present

## 2022-10-15 DIAGNOSIS — E559 Vitamin D deficiency, unspecified: Secondary | ICD-10-CM | POA: Diagnosis not present

## 2022-10-15 DIAGNOSIS — E782 Mixed hyperlipidemia: Secondary | ICD-10-CM | POA: Diagnosis not present

## 2022-10-15 DIAGNOSIS — Z23 Encounter for immunization: Secondary | ICD-10-CM | POA: Diagnosis not present

## 2022-10-15 DIAGNOSIS — E890 Postprocedural hypothyroidism: Secondary | ICD-10-CM | POA: Diagnosis not present

## 2022-10-15 DIAGNOSIS — M25542 Pain in joints of left hand: Secondary | ICD-10-CM | POA: Diagnosis not present

## 2022-10-15 DIAGNOSIS — I10 Essential (primary) hypertension: Secondary | ICD-10-CM | POA: Diagnosis not present

## 2022-10-15 DIAGNOSIS — E538 Deficiency of other specified B group vitamins: Secondary | ICD-10-CM | POA: Diagnosis not present

## 2022-10-15 DIAGNOSIS — Z0000 Encounter for general adult medical examination without abnormal findings: Secondary | ICD-10-CM | POA: Diagnosis not present

## 2022-11-12 ENCOUNTER — Ambulatory Visit (LOCAL_COMMUNITY_HEALTH_CENTER): Payer: Medicare Other

## 2022-11-12 DIAGNOSIS — Z719 Counseling, unspecified: Secondary | ICD-10-CM

## 2022-11-12 DIAGNOSIS — Z23 Encounter for immunization: Secondary | ICD-10-CM

## 2022-11-12 NOTE — Progress Notes (Signed)
  Are you feeling sick today? No   Have you ever received a dose of COVID-19 Vaccine? [employer], [place], [place], [place], Other) Yes  If yes, which vaccine and how many doses?   5 doses Pfizer   Did you bring the vaccination record card or other documentation?  Yes   Do you have a health condition or are undergoing treatment that makes you moderately or severely immunocompromised? This would include, but not be limited to: cancer, HIV, organ transplant, immunosuppressive therapy/high-dose corticosteroids, or moderate/severe primary immunodeficiency.  No  Have you received COVID-19 vaccine before or during hematopoietic cell transplant (HCT) or CAR-T-cell therapies? No  Have you ever had an allergic reaction to: (This would include a severe allergic reaction or a reaction that caused hives, swelling, or respiratory distress, including wheezing.) A component of a COVID-19 vaccine or a previous dose of COVID-19 vaccine? No   Have you ever had an allergic reaction to another vaccine (other thanCOVID-19 vaccine) or an injectable medication? (This would include a severe allergic reaction or a reaction that caused hives, swelling, or respiratory distress, including wheezing.)   No    Do you have a history of any of the following:  Myocarditis or Pericarditis No  Dermal fillers:  No  Multisystem Inflammatory Syndrome (MIS-C or MIS-A)? No  COVID-19 disease within the past 3 months? No  Vaccinated with monkeypox vaccine in the last 4 weeks? No  VIS provided.  IM in left deltoid.  Tolerated well.  COVID card updated and [facility] updated and copy provided. Waited 15 minutes.

## 2023-01-14 DIAGNOSIS — E7800 Pure hypercholesterolemia, unspecified: Secondary | ICD-10-CM | POA: Diagnosis not present

## 2023-01-14 DIAGNOSIS — H0589 Other disorders of orbit: Secondary | ICD-10-CM | POA: Diagnosis not present

## 2023-01-14 DIAGNOSIS — E7849 Other hyperlipidemia: Secondary | ICD-10-CM | POA: Diagnosis not present

## 2023-01-14 DIAGNOSIS — I10 Essential (primary) hypertension: Secondary | ICD-10-CM | POA: Diagnosis not present

## 2023-01-14 DIAGNOSIS — E039 Hypothyroidism, unspecified: Secondary | ICD-10-CM | POA: Diagnosis not present

## 2023-01-27 DIAGNOSIS — E7800 Pure hypercholesterolemia, unspecified: Secondary | ICD-10-CM | POA: Diagnosis not present

## 2023-01-27 DIAGNOSIS — I10 Essential (primary) hypertension: Secondary | ICD-10-CM | POA: Diagnosis not present

## 2023-01-27 DIAGNOSIS — E039 Hypothyroidism, unspecified: Secondary | ICD-10-CM | POA: Diagnosis not present

## 2023-01-29 ENCOUNTER — Other Ambulatory Visit: Payer: Self-pay | Admitting: Internal Medicine

## 2023-01-29 DIAGNOSIS — N958 Other specified menopausal and perimenopausal disorders: Secondary | ICD-10-CM

## 2023-02-04 ENCOUNTER — Ambulatory Visit (INDEPENDENT_AMBULATORY_CARE_PROVIDER_SITE_OTHER): Payer: Medicare Other

## 2023-02-04 DIAGNOSIS — N958 Other specified menopausal and perimenopausal disorders: Secondary | ICD-10-CM | POA: Diagnosis not present

## 2023-02-13 ENCOUNTER — Other Ambulatory Visit: Payer: Self-pay | Admitting: Cardiovascular Disease

## 2023-02-13 ENCOUNTER — Encounter: Payer: Self-pay | Admitting: Cardiovascular Disease

## 2023-02-13 ENCOUNTER — Ambulatory Visit (INDEPENDENT_AMBULATORY_CARE_PROVIDER_SITE_OTHER): Payer: Medicare Other | Admitting: Cardiovascular Disease

## 2023-02-13 VITALS — BP 110/70 | HR 69 | Ht 63.0 in | Wt 210.6 lb

## 2023-02-13 DIAGNOSIS — I10 Essential (primary) hypertension: Secondary | ICD-10-CM

## 2023-02-13 DIAGNOSIS — E782 Mixed hyperlipidemia: Secondary | ICD-10-CM

## 2023-02-13 DIAGNOSIS — R609 Edema, unspecified: Secondary | ICD-10-CM | POA: Diagnosis not present

## 2023-02-13 DIAGNOSIS — R0602 Shortness of breath: Secondary | ICD-10-CM

## 2023-02-13 NOTE — Progress Notes (Addendum)
Cardiology Office Note   Date:  02/14/2023   ID:  Cindy, [name] [birth year], MRN [id number]  PCP:  Perrin Maltese, MD  Cardiologist:  Neoma Laming, MD      History of Present Illness: Cindy [name] is a 77 y.o. female who presents for  Chief Complaint  Patient presents with   Follow-up    6 month follow up    Shortness of Breath This is a recurrent problem. The current episode started in the past 7 days. The problem occurs daily. The problem has been waxing and waning. The symptoms are aggravated by exercise. She has tried rest for the symptoms.    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  Radiation tx   BREAST SURGERY Right 04/20/2012   lumpectomy   CATARACT EXTRACTION W/PHACO Left 04/06/2018   Procedure: CATARACT EXTRACTION PHACO AND INTRAOCULAR LENS PLACEMENT ([place]) LEFT COMPLICATED;  Surgeon: Cindy Koyanagi, MD;  Location: [place];  Service: Ophthalmology;  Laterality: Left;  HEALON 5 VISION BLUE   CATARACT EXTRACTION W/PHACO Right 06/30/2018   Procedure: CATARACT EXTRACTION PHACO AND INTRAOCULAR LENS PLACEMENT ([place]) COMPLICATED   (MALYUGIN)  RIGHT;  Surgeon: Cindy Koyanagi, MD;  Location: [place];  Service: Ophthalmology;  Laterality: Right;   COLONOSCOPY     EYE SURGERY     THYROIDECTOMY  08/2017   [facility]) [facility] [place]     Current Outpatient Medications  Medication Sig Dispense Refill   acetaminophen (TYLENOL) 500 MG tablet Take 500 mg by mouth every 6 (six) hours as needed.      atorvastatin (LIPITOR) 20 MG tablet Take 20 mg by mouth daily.      bimatoprost (LUMIGAN) 0.03 % ophthalmic solution Place 1 drop into both eyes at bedtime.     brimonidine-timolol (COMBIGAN) 0.2-0.5 % ophthalmic solution Place 1 drop into both eyes every 12 (twelve) hours.     brinzolamide (AZOPT) 1 % ophthalmic suspension Place 1 drop into both eyes 2 (two) times daily.     calcium carbonate (TUMS - DOSED IN MG ELEMENTAL CALCIUM) 500 MG chewable tablet Chew 1 tablet by mouth daily.     levothyroxine (SYNTHROID) 88 MCG tablet Take 88 mcg by mouth daily before breakfast.     Vitamin D, Ergocalciferol, (DRISDOL) 50000 units CAPS capsule TAKE ONE CAPSULE ONCE WEEKLY  2   amLODipine (NORVASC) 5 MG tablet daily. (Patient not taking: Reported on 02/13/2023)  6   Calcium Carbonate-Vitamin D (CALTRATE 600+D PO) Take by mouth daily. (Patient not taking: Reported on 02/13/2023)     furosemide (LASIX) 20 MG tablet Take 20 mg by mouth. (Patient not taking: Reported on 02/13/2023)     lisinopril (ZESTRIL) 5 MG tablet Take 5 mg by mouth daily.     methotrexate (RHEUMATREX) 15 MG tablet Take 15 mg by mouth once a week. Caution: Chemotherapy. Protect from light. (Patient not taking: Reported on 02/13/2023)  metoprolol succinate (TOPROL-XL) 25 MG 24 hr tablet Take 12.5 mg by mouth daily.     spironolactone (ALDACTONE) 25 MG tablet Take 12.5 mg by mouth daily.     No current facility-administered medications for this visit.    Allergies:   Niacin    Social History:   reports that she has never smoked. She has never used smokeless tobacco. She reports that she does not drink alcohol and does not use drugs.   Family History:  family history includes Breast cancer in her cousin, maternal aunt, and mother.    ROS:     Review of Systems  Constitutional: Negative.   HENT: Negative.    Eyes: Negative.   Respiratory:  Positive for shortness of breath.   Gastrointestinal: Negative.   Genitourinary: Negative.    Musculoskeletal: Negative.   Skin: Negative.   Neurological: Negative.   Endo/Heme/Allergies: Negative.   Psychiatric/Behavioral: Negative.    All other systems reviewed and are negative.     All other systems are reviewed and negative.    PHYSICAL EXAM: VS:  BP 110/70   Pulse 69   Ht 5' 3"$  (1.6 m)   Wt 210 lb 9.6 oz (95.5 kg)   SpO2 98%   BMI 37.31 kg/m  , BMI Body mass index is 37.31 kg/m. Last weight:  Wt Readings from Last 3 Encounters:  02/14/23 208 lb 9.6 oz (94.6 kg)  02/13/23 210 lb 9.6 oz (95.5 kg)  09/24/18 205 lb 12.8 oz (93.4 kg)     Physical Exam Constitutional:      Appearance: Normal appearance.  Cardiovascular:     Rate and Rhythm: Normal rate and regular rhythm.     Heart sounds: Normal heart sounds.  Pulmonary:     Effort: Pulmonary effort is normal.     Breath sounds: Normal breath sounds.  Musculoskeletal:     Right lower leg: No edema.     Left lower leg: No edema.  Neurological:     Mental Status: She is alert.       EKG:   Recent Labs: No results found for requested labs within last 365 days.    Lipid Panel No results found for: "CHOL", "TRIG", "HDL", "CHOLHDL", "VLDL", "LDLCALC", "LDLDIRECT"    Other studies Reviewed: REASON FOR VISIT  Visit for: Echocardiogram/Shortness of breath  Sex:   Female  wt= 217   lbs.  BP= 136/76  Height= 63   inches.        TESTS  Imaging: Echocardiogram:  An echocardiogram in (2-d) mode was performed and in Doppler mode with color flow velocity mapping was performed. The aortic valve cusps are abnormal 1.4   cm, flow velocity 1.7   m/s, and systolic calculated mean flow gradient 6  mmHg. Mitral valve diastolic peak flow velocity E 0.66     m/s and E/A ratio 0.8. Aortic root diameter 2.2    cm. The LVOT internal diameter 1.7  cm and flow velocity was abnormal 1.0   m/s. LV systolic dimension 2.6 cm, diastolic 4.3  cm, posterior wall thickness 0.9    cm, fractional shortening 30  %, and EF  65 %. IVS thickness 0.8  cm. LA dimension 4.0 cm  RIGHT atrium= 11.1   cm2. Mitral Valve has Mild Regurgitation. Tricuspid Valve hasTrace to Mild Regurgitation.     ASSESSMENT  Suboptimal study due to poor windows.  Normal chamber sizes.  Normal left ventricular systolic function.  Normal right ventricular systolic function.  Normal right  ventricular diastolic function.  Normal left ventricular wall motion.  Normal right ventricular wall motion.  Trace to Mild tricuspid regurgitation.  Mild pulmonary hypertension.  Mild mitral regurgitation.  No pericardial effusion.  No LVH.     THERAPY   Referring physician: Dionisio David  Sonographer: Stu.      Neoma Laming MD  Electronically signed by: Neoma Laming     Date: 10/01/2021 13:55 TESTS                                                                                          [facility] [address] [place], [place] [address] [phone] STUDY:  Gated Stress / Rest Myocardial Perfusion Imaging Tomographic (SPECT) Including attenuation correction Wall Motion, Left Ventricular Ejection Fraction By Gated Technique.Treadmill Stress Test. SEX: Female    WEIGHT: 215 lbs  HEIGHT: 63 in            ARMS UP: YES/NO                                                                        REFERRING PHYSICIAN:  Dr.Garrick Midgley Humphrey Rolls                                                                                                                                                                                                                      INDICATION FOR STUDY: Abnormal EKG.  TECHNIQUE:  Approximately 20 minutes following the intravenous administration of 10.5 mCi of Tc-40mSestamibi after stress testing in a  reclined supine position with arms above their head if able to do so, gated SPECT imaging of the heart was performed. After about a 2hr break, the patient was injected intravenously with 33.7 mCi of Tc-933mestamibi.  Approximately 45 minutes later in the same position as stress imaging SPECT rest imaging of the heart was performed.  STRESS BY:  ShNeoma LamingMD PROTOCOL: BrDarnell Level                                                                                       MAX PRED HR: 149                     85%: 127               75%: 112                                                                                                                   RESTING BP: 114/70     RESTING HR: 82   PEAK BP: 162/80     PEAK HR: 136 (91%)                                                                     EXERCISE DURATION:  2:17                                            METS:  4.6    REASON FOR TEST TERMINATION: Target reached/1 min post injection  SYMPTOMS: Fatigue. SOB.  [facility] TREADMILL SCORE: 2                                       RISK:  Moderate                                                                                                                                                                                                           EKG RESULTS: Sinus bradycardia. 52/min. No significant ST changes at peak exercise.                                                              IMAGE QUALITY: Good  PERFUSION/WALL MOTION FINDINGS: EF = 88%. No perfusion defects, normal  wall motion.                                                                           IMPRESSION: Normal stress test with normal LVEF, poor exercise tolerance, moderate risk DTMS.                                                                                                                                                                                                                                                                                        Neoma Laming, MD Stress Interpreting Physician / Nuclear Interpreting Physician                 Neoma Laming MD  Electronically signed by: Neoma Laming     Date: 06/13/2020 11:21   ASSESSMENT AND PLAN:    ICD-10-CM   1. SOB (shortness of breath)  R06.02 PCV ECHOCARDIOGRAM COMPLETE   Gets SOB on exertion.    2. Edema, unspecified type  R60.9 PCV ECHOCARDIOGRAM COMPLETE    3. Mixed hyperlipidemia  E78.2     4. Primary hypertension  I10 PCV ECHOCARDIOGRAM COMPLETE    5. Essential hypertension, benign  I10        Problem List Items Addressed This Visit       Cardiovascular and Mediastinum   Essential hypertension, benign   Other Visit Diagnoses     SOB (shortness of breath)    -  Primary   Gets SOB on exertion.   Relevant Orders   PCV ECHOCARDIOGRAM COMPLETE   Edema, unspecified type       Relevant Orders   PCV ECHOCARDIOGRAM COMPLETE   Mixed hyperlipidemia       Primary hypertension       Relevant Orders   PCV ECHOCARDIOGRAM COMPLETE  Disposition:   Return in about 4 weeks (around 03/13/2023) for After wecho.     Signed,  Neoma Laming, MD  02/14/2023 12:43 PM    [facility]

## 2023-02-14 ENCOUNTER — Ambulatory Visit (INDEPENDENT_AMBULATORY_CARE_PROVIDER_SITE_OTHER): Payer: Medicare Other | Admitting: Internal Medicine

## 2023-02-14 ENCOUNTER — Encounter: Payer: Self-pay | Admitting: Internal Medicine

## 2023-02-14 VITALS — BP 110/62 | HR 76 | Ht 63.0 in | Wt 208.6 lb

## 2023-02-14 DIAGNOSIS — E0500 Thyrotoxicosis with diffuse goiter without thyrotoxic crisis or storm: Secondary | ICD-10-CM | POA: Diagnosis not present

## 2023-02-14 DIAGNOSIS — E559 Vitamin D deficiency, unspecified: Secondary | ICD-10-CM | POA: Insufficient documentation

## 2023-02-14 DIAGNOSIS — E782 Mixed hyperlipidemia: Secondary | ICD-10-CM

## 2023-02-14 DIAGNOSIS — E538 Deficiency of other specified B group vitamins: Secondary | ICD-10-CM

## 2023-02-14 DIAGNOSIS — I10 Essential (primary) hypertension: Secondary | ICD-10-CM | POA: Diagnosis not present

## 2023-02-14 NOTE — Progress Notes (Signed)
Established Patient Office Visit  Subjective:  Patient ID: Cindy [name], female    DOB: 1946-04-07  Age: 77 y.o. MRN: [id number]  Chief Complaint  Patient presents with   Follow-up    4 month follow up    Patient comes in for her follow-up today.  She has been doing well since her last visit.  No new complaints.  She is fasting for her blood work. She keeps up with her regular follow-up appointments with the ophthalmologist for follow-up who are monitoring her for glaucoma and thyroid eye disease.     Past Medical History:  Diagnosis Date   Breast cancer ([place]) 2013   right breast cancer lumpectomy and rad tx   Cancer ([place])    breast   Heart murmur    followed by PCP   Hyperlipidemia    Hypertension    Hypothyroidism    Personal history of radiation therapy 2013   Right breast   Wears dentures    partial upper    Social History   Socioeconomic History   Marital status: Married    Spouse name: Not on file   Number of children: Not on file   Years of education: Not on file   Highest education level: Not on file  Occupational History   Not on file  Tobacco Use   Smoking status: Never   Smokeless tobacco: Never  Vaping Use   Vaping Use: Never used  Substance and Sexual Activity   Alcohol use: No    Alcohol/week: 0.0 standard drinks of alcohol   Drug use: No   Sexual activity: Not on file  Other Topics Concern   Not on file  Social History Narrative   Not on file   Social Determinants of Health   Financial Resource Strain: Not on file  Food Insecurity: Not on file  Transportation Needs: Not on file  Physical Activity: Not on file  Stress: Not on file  Social Connections: Not on file  Intimate Partner Violence: Not on file    Family History  Problem Relation Age of Onset   Breast cancer Cousin    Breast cancer Mother        57's   Breast cancer Maternal Aunt        60's    Allergies  Allergen Reactions   Niacin Rash    Hives    Review  of Systems  Constitutional:  Negative for chills, fever and malaise/fatigue.  HENT:  Negative for congestion, ear discharge, ear pain, hearing loss and tinnitus.   Eyes:  Positive for blurred vision. Negative for double vision, photophobia, pain, discharge and redness.  Respiratory:  Negative for cough and hemoptysis.   Cardiovascular:  Negative for chest pain, palpitations, orthopnea, claudication and leg swelling.  Gastrointestinal:  Negative for abdominal pain, blood in stool, constipation, diarrhea, heartburn, nausea and vomiting.  Genitourinary:  Negative for dysuria, frequency, hematuria and urgency.  Musculoskeletal:  Negative for back pain, falls, joint pain, myalgias and neck pain.  Skin:  Negative for rash.  Neurological:  Negative for dizziness, tingling, seizures, loss of consciousness, weakness and headaches.  Psychiatric/Behavioral:  Negative for depression. The patient is not nervous/anxious and does not have insomnia.        Objective:   BP 110/62   Pulse 76   Ht 5' 3"$  (1.6 m)   Wt 208 lb 9.6 oz (94.6 kg)   SpO2 96%   BMI 36.95 kg/m   Vitals:   02/14/23 0910  BP: 110/62  Pulse: 76  Height: 5' 3"$  (1.6 m)  Weight: 208 lb 9.6 oz (94.6 kg)  SpO2: 96%  BMI (Calculated): 36.96    Physical Exam Vitals and nursing note reviewed.  Constitutional:      Appearance: Normal appearance.  HENT:     Head: Normocephalic.  Cardiovascular:     Rate and Rhythm: Normal rate and regular rhythm.  Pulmonary:     Effort: Pulmonary effort is normal.     Breath sounds: Normal breath sounds.  Musculoskeletal:        General: Normal range of motion.     Cervical back: Normal range of motion.  Skin:    General: Skin is warm and dry.  Neurological:     General: No focal deficit present.     Mental Status: She is alert and oriented to person, place, and time.  Psychiatric:        Mood and Affect: Mood normal.        Behavior: Behavior normal.      No results found for  any visits on 02/14/23.  No results found for this or any previous visit (from the past 2160 hour(s)).    Assessment & Plan:  Patient advised to continue all her medications regularly.  She She will check with her ophthalmologist about Netty Starring as a treatment choice for her thyroid eye disease. Will get her lab work today. Problem List Items Addressed This Visit     Graves' ophthalmopathy   Relevant Orders   TSH+T4F+T3Free   Vitamin B12 deficiency   Relevant Orders   Vitamin B12   Mixed hyperlipidemia   Relevant Orders   Lipid Panel w/o Chol/HDL Ratio   Essential hypertension, benign - Primary   Relevant Orders   CMP14+EGFR   Vitamin D deficiency   Relevant Orders   Vitamin D (25 hydroxy)    Return in about 4 months (around 06/15/2023).   Total time spent: 30 minutes  Perrin Maltese, MD  02/14/2023

## 2023-02-19 LAB — CMP14+EGFR
ALT: 23 IU/L (ref 0–32)
AST: 19 IU/L (ref 0–40)
Albumin/Globulin Ratio: 2.5 — ABNORMAL HIGH (ref 1.2–2.2)
Albumin: 4.2 g/dL (ref 3.8–4.8)
Alkaline Phosphatase: 145 IU/L — ABNORMAL HIGH (ref 44–121)
BUN/Creatinine Ratio: 18 (ref 12–28)
BUN: 16 mg/dL (ref 8–27)
Bilirubin Total: 0.7 mg/dL (ref 0.0–1.2)
CO2: 26 mmol/L (ref 20–29)
Calcium: 9.6 mg/dL (ref 8.7–10.3)
Chloride: 104 mmol/L (ref 96–106)
Creatinine, Ser: 0.88 mg/dL (ref 0.57–1.00)
Globulin, Total: 1.7 g/dL (ref 1.5–4.5)
Glucose: 99 mg/dL (ref 70–99)
Potassium: 4.9 mmol/L (ref 3.5–5.2)
Sodium: 143 mmol/L (ref 134–144)
Total Protein: 5.9 g/dL — ABNORMAL LOW (ref 6.0–8.5)
eGFR: 69 mL/min/{1.73_m2} (ref >59)

## 2023-02-19 LAB — LIPID PANEL W/O CHOL/HDL RATIO
Cholesterol, Total: 167 mg/dL (ref 100–199)
HDL: 60 mg/dL (ref >39)
LDL Chol Calc (NIH): 92 mg/dL (ref 0–99)
Triglycerides: 77 mg/dL (ref 0–149)
VLDL Cholesterol Cal: 15 mg/dL (ref 5–40)

## 2023-02-19 LAB — VITAMIN D 25 HYDROXY (VIT D DEFICIENCY, FRACTURES): Vit D, 25-Hydroxy: 85.1 ng/mL (ref 30.0–100.0)

## 2023-02-19 LAB — TSH+T4F+T3FREE
Free T4: 1.29 ng/dL (ref 0.82–1.77)
T3, Free: 2.7 pg/mL (ref 2.0–4.4)
TSH: 2.7 u[IU]/mL (ref 0.450–4.500)

## 2023-02-19 LAB — VITAMIN B12: Vitamin B-12: 223 pg/mL — ABNORMAL LOW (ref 232–1245)

## 2023-02-21 ENCOUNTER — Ambulatory Visit (INDEPENDENT_AMBULATORY_CARE_PROVIDER_SITE_OTHER): Payer: Medicare Other | Admitting: Cardiovascular Disease

## 2023-02-21 DIAGNOSIS — R0602 Shortness of breath: Secondary | ICD-10-CM

## 2023-02-21 DIAGNOSIS — I351 Nonrheumatic aortic (valve) insufficiency: Secondary | ICD-10-CM | POA: Diagnosis not present

## 2023-02-21 DIAGNOSIS — R609 Edema, unspecified: Secondary | ICD-10-CM

## 2023-02-21 DIAGNOSIS — I10 Essential (primary) hypertension: Secondary | ICD-10-CM

## 2023-03-04 NOTE — Progress Notes (Signed)
Closing encounter for provider.

## 2023-03-07 ENCOUNTER — Ambulatory Visit: Payer: Medicare Other | Admitting: Cardiovascular Disease

## 2023-03-07 ENCOUNTER — Encounter: Payer: Self-pay | Admitting: Cardiovascular Disease

## 2023-03-07 VITALS — BP 120/70 | HR 88 | Ht 63.0 in | Wt 208.2 lb

## 2023-03-07 DIAGNOSIS — R0602 Shortness of breath: Secondary | ICD-10-CM | POA: Diagnosis not present

## 2023-03-07 DIAGNOSIS — I10 Essential (primary) hypertension: Secondary | ICD-10-CM

## 2023-03-07 DIAGNOSIS — I340 Nonrheumatic mitral (valve) insufficiency: Secondary | ICD-10-CM | POA: Diagnosis not present

## 2023-03-07 NOTE — Progress Notes (Signed)
Cardiology Office Note   Date:  03/07/2023   ID:  Cindy, [name] 04/23/1946, MRN [id number]  PCP:  Perrin Maltese, MD  Cardiologist:  Neoma Laming, MD      History of Present Illness: Cindy [name] is a 77 y.o. 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  Radiation tx   BREAST SURGERY Right 04/20/2012   lumpectomy   CATARACT EXTRACTION W/PHACO Left 04/06/2018   Procedure: CATARACT EXTRACTION PHACO AND INTRAOCULAR LENS PLACEMENT ([place]) LEFT COMPLICATED;  Surgeon: Leandrew Koyanagi, MD;  Location: [place];  Service: Ophthalmology;  Laterality: Left;  HEALON 5 VISION BLUE   CATARACT EXTRACTION W/PHACO Right 06/30/2018   Procedure: CATARACT EXTRACTION PHACO AND INTRAOCULAR LENS PLACEMENT ([place]) COMPLICATED   (MALYUGIN)  RIGHT;  Surgeon: Leandrew Koyanagi, MD;  Location: [place];  Service: Ophthalmology;  Laterality: Right;   COLONOSCOPY     EYE SURGERY     THYROIDECTOMY  08/2017   [facility] [place]     Current Outpatient Medications  Medication Sig Dispense Refill   acetaminophen (TYLENOL) 500 MG tablet Take 500 mg by mouth every 6 (six) hours as needed.     atorvastatin (LIPITOR) 20 MG tablet Take 20 mg by mouth daily.      bimatoprost (LUMIGAN) 0.03 % ophthalmic solution Place 1 drop into both eyes at bedtime.     brimonidine-timolol (COMBIGAN) 0.2-0.5 % ophthalmic solution Place 1 drop  into both eyes every 12 (twelve) hours.     brinzolamide (AZOPT) 1 % ophthalmic suspension Place 1 drop into both eyes 2 (two) times daily.     calcium carbonate (TUMS - DOSED IN MG ELEMENTAL CALCIUM) 500 MG chewable tablet Chew 1 tablet by mouth daily.     levothyroxine (SYNTHROID) 88 MCG tablet Take 88 mcg by mouth daily before breakfast.     lisinopril (ZESTRIL) 5 MG tablet Take 5 mg by mouth daily.     metoprolol succinate (TOPROL-XL) 25 MG 24 hr tablet Take 12.5 mg by mouth daily.     spironolactone (ALDACTONE) 25 MG tablet Take 12.5 mg by mouth daily.     Vitamin D, Ergocalciferol, (DRISDOL) 50000 units CAPS capsule TAKE ONE CAPSULE ONCE WEEKLY  2   amLODipine (NORVASC) 5 MG tablet daily. (Patient not taking: Reported on 02/13/2023)  6   Calcium Carbonate-Vitamin D (CALTRATE 600+D PO) Take by mouth daily. (Patient not taking: Reported on 02/13/2023)     furosemide (LASIX) 20 MG tablet Take 20 mg by mouth. (Patient not taking: Reported on 02/13/2023)     methotrexate (RHEUMATREX) 15 MG tablet Take 15 mg by mouth once a week. Caution: Chemotherapy. Protect from light.     No current facility-administered medications for this visit.    Allergies:  Niacin    Social History:   reports that she has never smoked. She has never used smokeless tobacco. She reports that she does not drink alcohol and does not use drugs.   Family History:  family history includes Breast cancer in her cousin, maternal aunt, and mother.    ROS:     Review of Systems  Constitutional: Negative.   HENT: Negative.    Eyes: Negative.   Respiratory: Negative.    Gastrointestinal: Negative.   Genitourinary: Negative.   Musculoskeletal: Negative.   Skin: Negative.   Neurological: Negative.   Endo/Heme/Allergies: Negative.   Psychiatric/Behavioral: Negative.    All other systems reviewed and are negative.     All other systems are reviewed and negative.    PHYSICAL EXAM: VS:  BP 120/70   Pulse 88   Ht 5'  3" (1.6 m)   Wt 208 lb 3.2 oz (94.4 kg)   SpO2 96%   BMI 36.88 kg/m  , BMI Body mass index is 36.88 kg/m. Last weight:  Wt Readings from Last 3 Encounters:  03/07/23 208 lb 3.2 oz (94.4 kg)  02/14/23 208 lb 9.6 oz (94.6 kg)  02/13/23 210 lb 9.6 oz (95.5 kg)     Physical Exam Constitutional:      Appearance: Normal appearance.  Cardiovascular:     Rate and Rhythm: Normal rate and regular rhythm.     Heart sounds: Normal heart sounds.  Pulmonary:     Effort: Pulmonary effort is normal.     Breath sounds: Normal breath sounds.  Musculoskeletal:     Right lower leg: No edema.     Left lower leg: No edema.  Neurological:     Mental Status: She is alert.       EKG:   Recent Labs: 02/14/2023: ALT 23; BUN 16; Creatinine, Ser 0.88; Potassium 4.9; Sodium 143; TSH 2.700    Lipid Panel    Component Value Date/Time   CHOL 167 02/14/2023 0946   TRIG 77 02/14/2023 0946   HDL 60 02/14/2023 0946   LDLCALC 92 02/14/2023 0946      REASON FOR VISIT  Visit for: Echocardiogram/Shortness of breath  Sex:   Female  wt= 217   lbs.  BP= 136/76  Height= 63   inches.        TESTS  Imaging: Echocardiogram:  An echocardiogram in (2-d) mode was performed and in Doppler mode with color flow velocity mapping was performed. The aortic valve cusps are abnormal 1.4   cm, flow velocity 1.7   m/s, and systolic calculated mean flow gradient 6  mmHg. Mitral valve diastolic peak flow velocity E 0.66     m/s and E/A ratio 0.8. Aortic root diameter 2.2    cm. The LVOT internal diameter 1.7  cm and flow velocity was abnormal 1.0   m/s. LV systolic dimension 2.6 cm, diastolic 4.3  cm, posterior wall thickness 0.9    cm, fractional shortening 30  %, and EF 65 %. IVS thickness 0.8  cm. LA dimension 4.0 cm  RIGHT atrium= 11.1   cm2. Mitral Valve has Mild Regurgitation. Tricuspid Valve hasTrace to Mild Regurgitation.     ASSESSMENT  Suboptimal study due to poor windows.  Normal chamber  sizes.  Normal left ventricular systolic function.  Normal right ventricular systolic function.  Normal right ventricular diastolic function.  Normal left ventricular wall motion.  Normal right ventricular wall motion.  Trace to Mild tricuspid regurgitation.  Mild pulmonary hypertension.  Mild mitral  regurgitation.  No pericardial effusion.  No LVH.     THERAPY   Referring physician: Dionisio David  Sonographer: Stu.      Neoma Laming MD  Electronically signed by: Neoma Laming     Date: 10/01/2021 13:55 REASON FOR VISIT  Visit for: Echocardiogram/Shortness of breath  Sex:   Female  wt= 217   lbs.  BP= 136/76  Height= 63   inches.        TESTS  Imaging: Echocardiogram:  An echocardiogram in (2-d) mode was performed and in Doppler mode with color flow velocity mapping was performed. The aortic valve cusps are abnormal 1.4   cm, flow velocity 1.7   m/s, and systolic calculated mean flow gradient 6  mmHg. Mitral valve diastolic peak flow velocity E 0.66     m/s and E/A ratio 0.8. Aortic root diameter 2.2    cm. The LVOT internal diameter 1.7  cm and flow velocity was abnormal 1.0   m/s. LV systolic dimension 2.6 cm, diastolic 4.3  cm, posterior wall thickness 0.9    cm, fractional shortening 30  %, and EF 65 %. IVS thickness 0.8  cm. LA dimension 4.0 cm  RIGHT atrium= 11.1   cm2. Mitral Valve has Mild Regurgitation. Tricuspid Valve hasTrace to Mild Regurgitation.     ASSESSMENT  Suboptimal study due to poor windows.  Normal chamber sizes.  Normal left ventricular systolic function.  Normal right ventricular systolic function.  Normal right ventricular diastolic function.  Normal left ventricular wall motion.  Normal right ventricular wall motion.  Trace to Mild tricuspid regurgitation.  Mild pulmonary hypertension.  Mild mitral regurgitation.  No pericardial effusion.  No LVH.     THERAPY   Referring physician: Dionisio David  Sonographer: Stu.       Neoma Laming MD  Electronically signed by: Neoma Laming     Date: 10/01/2021 13:55 Other studies Reviewed: Additional studies/ records that were reviewed today include:  Review of the above records demonstrates:       No data to display            ASSESSMENT AND PLAN:    ICD-10-CM   1. SOB (shortness of breath)  R06.02    Echo showed EF 53% and mild MR, but doing fine.    2. Essential hypertension, benign  I10     3. Nonrheumatic mitral valve regurgitation  I34.0        Problem List Items Addressed This Visit       Cardiovascular and Mediastinum   Essential hypertension, benign   Other Visit Diagnoses     SOB (shortness of breath)    -  Primary   Echo showed EF 53% and mild MR, but doing fine.   Nonrheumatic mitral valve regurgitation              Disposition:   Return in about 3 months (around 06/07/2023).    Total time spent: 30 minutes  Signed,  Neoma Laming, MD  03/07/2023 10:26 AM    [place]

## 2023-04-01 DIAGNOSIS — H40003 Preglaucoma, unspecified, bilateral: Secondary | ICD-10-CM | POA: Diagnosis not present

## 2023-04-08 DIAGNOSIS — Z961 Presence of intraocular lens: Secondary | ICD-10-CM | POA: Diagnosis not present

## 2023-04-08 DIAGNOSIS — E0500 Thyrotoxicosis with diffuse goiter without thyrotoxic crisis or storm: Secondary | ICD-10-CM | POA: Diagnosis not present

## 2023-04-08 DIAGNOSIS — H35031 Hypertensive retinopathy, right eye: Secondary | ICD-10-CM | POA: Diagnosis not present

## 2023-04-08 DIAGNOSIS — H40003 Preglaucoma, unspecified, bilateral: Secondary | ICD-10-CM | POA: Diagnosis not present

## 2023-04-22 ENCOUNTER — Other Ambulatory Visit: Payer: Self-pay | Admitting: Internal Medicine

## 2023-04-22 DIAGNOSIS — G8929 Other chronic pain: Secondary | ICD-10-CM

## 2023-05-05 ENCOUNTER — Other Ambulatory Visit: Payer: Self-pay | Admitting: Cardiovascular Disease

## 2023-05-17 ENCOUNTER — Other Ambulatory Visit: Payer: Self-pay | Admitting: Cardiovascular Disease

## 2023-05-17 DIAGNOSIS — R609 Edema, unspecified: Secondary | ICD-10-CM

## 2023-05-19 ENCOUNTER — Other Ambulatory Visit: Payer: Self-pay | Admitting: Cardiovascular Disease

## 2023-05-19 DIAGNOSIS — I351 Nonrheumatic aortic (valve) insufficiency: Secondary | ICD-10-CM

## 2023-07-10 DIAGNOSIS — E039 Hypothyroidism, unspecified: Secondary | ICD-10-CM | POA: Diagnosis not present

## 2023-07-21 ENCOUNTER — Ambulatory Visit: Payer: Medicare Other | Admitting: Cardiology

## 2023-07-21 ENCOUNTER — Encounter: Payer: Self-pay | Admitting: Cardiology

## 2023-07-21 VITALS — BP 110/75 | HR 71 | Ht 63.0 in | Wt 210.6 lb

## 2023-07-21 DIAGNOSIS — I340 Nonrheumatic mitral (valve) insufficiency: Secondary | ICD-10-CM | POA: Diagnosis not present

## 2023-07-21 DIAGNOSIS — I10 Essential (primary) hypertension: Secondary | ICD-10-CM | POA: Diagnosis not present

## 2023-07-21 DIAGNOSIS — E782 Mixed hyperlipidemia: Secondary | ICD-10-CM

## 2023-07-21 DIAGNOSIS — R609 Edema, unspecified: Secondary | ICD-10-CM | POA: Diagnosis not present

## 2023-07-21 NOTE — Assessment & Plan Note (Signed)
Well controlled. Continue same medications. 

## 2023-07-21 NOTE — Assessment & Plan Note (Signed)
Take Lasix as needed on days when not going out of the house. Try compression hose and elevating legs.

## 2023-07-21 NOTE — Assessment & Plan Note (Signed)
Well controlled today. Continue same medications.  

## 2023-07-21 NOTE — Progress Notes (Signed)
Cardiology Office Note   Date:  07/21/2023   ID:  Cindy [name], Cindy [name] [birth year], MRN [id number]  PCP:  Margaretann Loveless, MD  Cardiologist:  Marisue Ivan, NP      History of Present Illness: Cindy [name] is a 77 y.o. female who presents for  Chief Complaint  Patient presents with   Follow-up    F/U    Patient in office for routine cardiac exam. Denies chest pain, shortness of breath, dizziness. Complains of lower extremity edema. Patient reports not taking Lasix due to having to go to the bathroom so much.      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  Radiation tx   BREAST SURGERY Right 04/20/2012   lumpectomy   CATARACT EXTRACTION W/PHACO Left 04/06/2018   Procedure: CATARACT EXTRACTION PHACO AND INTRAOCULAR LENS PLACEMENT ([facility]) LEFT COMPLICATED;  Surgeon: Lockie Mola, MD;  Location: [facility];  Service: Ophthalmology;  Laterality: Left;  HEALON 5 VISION BLUE   CATARACT EXTRACTION W/PHACO Right 06/30/2018   Procedure: CATARACT EXTRACTION PHACO AND INTRAOCULAR LENS PLACEMENT ([facility]) COMPLICATED   ([place])  RIGHT;  Surgeon: Lockie Mola, MD;  Location: [facility];  Service: Ophthalmology;  Laterality: Right;   COLONOSCOPY     EYE SURGERY     THYROIDECTOMY  08/2017   [facility] [place]     Current Outpatient Medications  Medication Sig Dispense Refill   acetaminophen (TYLENOL) 500 MG tablet Take 500 mg by mouth every 6 (six) hours as needed.     atorvastatin (LIPITOR) 20 MG tablet Take  20 mg by mouth daily.      bimatoprost (LUMIGAN) 0.03 % ophthalmic solution Place 1 drop into both eyes at bedtime.     brimonidine-timolol (COMBIGAN) 0.2-0.5 % ophthalmic solution Place 1 drop into both eyes every 12 (twelve) hours.     brinzolamide (AZOPT) 1 % ophthalmic suspension Place 1 drop into both eyes 2 (two) times daily.     furosemide (LASIX) 20 MG tablet Take 20 mg by mouth as needed for edema.     levothyroxine (SYNTHROID) 75 MCG tablet Take 75 mcg by mouth daily before breakfast.     lisinopril (ZESTRIL) 5 MG tablet TAKE 1 TABLET BY MOUTH EVERY DAY 90 tablet 1   meloxicam (MOBIC) 15 MG tablet TAKE 1 TABLET A DAY WITH FOOD. 30 tablet 2   metoprolol succinate (TOPROL-XL) 25 MG 24 hr tablet TAKE 1/2 TABLET BY MOUTH DAILY 45 tablet 1   spironolactone (ALDACTONE) 25 MG tablet TAKE 1/2 TABLET BY MOUTH DAILY 45 tablet 1   Vitamin D, Ergocalciferol, (DRISDOL) 50000 units CAPS capsule TAKE ONE CAPSULE ONCE WEEKLY  2   No current facility-administered medications for this visit.    Allergies:   Niacin    Social History:   reports that she has never smoked. She has never used smokeless tobacco. She reports that she does not drink alcohol  and does not use drugs.   Family History:  family history includes Breast cancer in her cousin, maternal aunt, and mother.    ROS:     Review of Systems  Constitutional: Negative.   HENT: Negative.    Eyes: Negative.   Respiratory: Negative.    Cardiovascular:  Positive for leg swelling.  Gastrointestinal: Negative.   Genitourinary: Negative.   Musculoskeletal: Negative.   Skin: Negative.   Neurological: Negative.   Endo/Heme/Allergies: Negative.   Psychiatric/Behavioral: Negative.    All other systems reviewed and are negative.    All other systems are reviewed and negative.    PHYSICAL EXAM: VS:  BP 110/75   Pulse 71   Ht 5\' 3"  (1.6 m)   Wt 210 lb 9.6 oz (95.5 kg)   SpO2 94%   BMI 37.31 kg/m  , BMI Body mass index is 37.31  kg/m. Last weight:  Wt Readings from Last 3 Encounters:  07/21/23 210 lb 9.6 oz (95.5 kg)  03/07/23 208 lb 3.2 oz (94.4 kg)  02/14/23 208 lb 9.6 oz (94.6 kg)     Physical Exam Vitals and nursing note reviewed.  Constitutional:      Appearance: Normal appearance. She is normal weight.  HENT:     Head: Normocephalic and atraumatic.     Nose: Nose normal.     Mouth/Throat:     Mouth: Mucous membranes are moist.     Pharynx: Oropharynx is clear.  Eyes:     Conjunctiva/sclera: Conjunctivae normal.     Pupils: Pupils are equal, round, and reactive to light.  Cardiovascular:     Rate and Rhythm: Normal rate and regular rhythm.     Pulses: Normal pulses.     Heart sounds: Normal heart sounds.  Pulmonary:     Effort: Pulmonary effort is normal.     Breath sounds: Normal breath sounds.  Abdominal:     General: Abdomen is flat. Bowel sounds are normal.     Palpations: Abdomen is soft.  Musculoskeletal:        General: Normal range of motion.     Cervical back: Normal range of motion.     Right lower leg: Edema present.     Left lower leg: Edema present.  Skin:    General: Skin is warm and dry.  Neurological:     General: No focal deficit present.     Mental Status: She is alert and oriented to person, place, and time. Mental status is at baseline.  Psychiatric:        Mood and Affect: Mood normal.        Behavior: Behavior normal.      EKG: none today  Recent Labs: 02/14/2023: ALT 23; BUN 16; Creatinine, Ser 0.88; Potassium 4.9; Sodium 143; TSH 2.700    Lipid Panel    Component Value Date/Time   CHOL 167 02/14/2023 0946   TRIG 77 02/14/2023 0946   HDL 60 02/14/2023 0946   LDLCALC 92 02/14/2023 0946     ASSESSMENT AND PLAN:    ICD-10-CM   1. Essential hypertension, benign  I10     2. Mixed hyperlipidemia  E78.2     3. Nonrheumatic mitral valve regurgitation  I34.0     4. Edema, unspecified type  R60.9        Problem List Items Addressed This Visit        Cardiovascular and Mediastinum   Essential hypertension, benign - Primary    Well controlled today. Continue same  medications.       Relevant Medications   furosemide (LASIX) 20 MG tablet   Nonrheumatic mitral valve regurgitation    01/2023 Mitral valve has trace regurgitation       Relevant Medications   furosemide (LASIX) 20 MG tablet     Other   Mixed hyperlipidemia    Well controlled. Continue same medications.       Relevant Medications   furosemide (LASIX) 20 MG tablet   Edema    Take Lasix as needed on days when not going out of the house. Try compression hose and elevating legs.         Disposition:   Return in about 4 months (around 11/21/2023).    Total time spent: 25 minutes  Signed,  Google, NP  07/21/2023 11:01 AM    [facility]

## 2023-07-21 NOTE — Assessment & Plan Note (Signed)
01/2023 Mitral valve has trace regurgitation

## 2023-08-15 ENCOUNTER — Encounter: Payer: Self-pay | Admitting: Internal Medicine

## 2023-08-15 ENCOUNTER — Ambulatory Visit (INDEPENDENT_AMBULATORY_CARE_PROVIDER_SITE_OTHER): Payer: Medicare Other | Admitting: Internal Medicine

## 2023-08-15 VITALS — BP 125/65 | HR 97 | Ht 63.0 in | Wt 207.2 lb

## 2023-08-15 DIAGNOSIS — M25551 Pain in right hip: Secondary | ICD-10-CM

## 2023-08-15 DIAGNOSIS — I10 Essential (primary) hypertension: Secondary | ICD-10-CM

## 2023-08-15 DIAGNOSIS — E0500 Thyrotoxicosis with diffuse goiter without thyrotoxic crisis or storm: Secondary | ICD-10-CM | POA: Diagnosis not present

## 2023-08-15 DIAGNOSIS — Z1239 Encounter for other screening for malignant neoplasm of breast: Secondary | ICD-10-CM | POA: Diagnosis not present

## 2023-08-15 DIAGNOSIS — E782 Mixed hyperlipidemia: Secondary | ICD-10-CM | POA: Diagnosis not present

## 2023-08-15 DIAGNOSIS — M8580 Other specified disorders of bone density and structure, unspecified site: Secondary | ICD-10-CM | POA: Diagnosis not present

## 2023-08-15 LAB — POCT URINALYSIS DIPSTICK
Bilirubin, UA: NEGATIVE
Glucose, UA: NEGATIVE
Ketones, UA: NEGATIVE
Leukocytes, UA: NEGATIVE
Nitrite, UA: NEGATIVE
Protein, UA: NEGATIVE
Spec Grav, UA: 1.025 (ref 1.010–1.025)
Urobilinogen, UA: 0.2 E.U./dL
pH, UA: 5.5 (ref 5.0–8.0)

## 2023-08-15 NOTE — Progress Notes (Signed)
Established Patient Office Visit  Subjective:  Patient ID: Cindy [name], female    DOB: May 21, 1946  Age: 77 y.o. MRN: [id number]  Chief Complaint  Patient presents with   Follow-up    6 mo f/u    Patient comes in for her follow-up today.  She is generally feeling well but mentions that her legs are swelling more than usual.  She admits that she is not taking her Lasix like she is supposed to be taking them because it makes her go to the bathroom.  She was also recommended to wear compression stockings but is also not doing that either. Also complains of right knee and right hip pain she only takes her meloxicam occasionally, will set up an orthopedic consult, may need a steroid injection. Patient is fasting for blood work.  Also need to schedule for DEXA scan and mammogram.     No other concerns at this time.   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  Radiation tx   BREAST SURGERY Right 04/20/2012   lumpectomy   CATARACT EXTRACTION W/PHACO Left 04/06/2018   Procedure: CATARACT EXTRACTION PHACO AND INTRAOCULAR LENS PLACEMENT (IOC) LEFT COMPLICATED;  Surgeon: Lockie Mola, MD;  Location: [facility];  Service: Ophthalmology;  Laterality: Left;  HEALON 5 VISION BLUE   CATARACT EXTRACTION W/PHACO Right 06/30/2018   Procedure: CATARACT EXTRACTION PHACO AND INTRAOCULAR LENS PLACEMENT (IOC) COMPLICATED   (MALYUGIN)  RIGHT;  Surgeon: Lockie Mola, MD;  Location: [facility];  Service: Ophthalmology;  Laterality:  Right;   COLONOSCOPY     EYE SURGERY     THYROIDECTOMY  08/2017   [facility] - [place]    Social History   Socioeconomic History   Marital status: Married    Spouse name: Not on file   Number of children: Not on file   Years of education: Not on file   Highest education level: Not on file  Occupational History   Not on file  Tobacco Use   Smoking status: Never   Smokeless tobacco: Never  Vaping Use   Vaping status: Never Used  Substance and Sexual Activity   Alcohol use: No    Alcohol/week: 0.0 standard drinks of alcohol   Drug use: No   Sexual activity: Not on file  Other Topics Concern   Not on file  Social History Narrative   Not on file   Social Determinants of Health   Financial Resource Strain: Not on file  Food Insecurity: Not on file  Transportation Needs: Not on file  Physical Activity: Not on file  Stress: Not on file  Social Connections: Not on file  Intimate Partner Violence: Not on file    Family History  Problem Relation Age of Onset   Breast cancer Cousin    Breast cancer Mother        24's   Breast cancer Maternal Aunt  60's    Allergies  Allergen Reactions   Niacin Rash    Hives    Review of Systems  Constitutional: Negative.  Negative for chills, fever, malaise/fatigue and weight loss.  HENT: Negative.    Eyes:  Positive for blurred vision.  Respiratory: Negative.  Negative for cough and shortness of breath.   Cardiovascular: Negative.  Negative for chest pain, palpitations and leg swelling.  Gastrointestinal: Negative.  Negative for abdominal pain, constipation, diarrhea, heartburn, nausea and vomiting.  Genitourinary: Negative.  Negative for dysuria and flank pain.  Musculoskeletal:  Positive for joint pain. Negative for myalgias.  Skin: Negative.   Neurological: Negative.  Negative for dizziness and headaches.  Endo/Heme/Allergies: Negative.   Psychiatric/Behavioral: Negative.  Negative for depression and suicidal ideas.  The patient is not nervous/anxious.        Objective:   BP 125/65   Pulse 97   Ht 5\' 3"  (1.6 m)   Wt 207 lb 3.2 oz (94 kg)   SpO2 97%   BMI 36.70 kg/m   Vitals:   08/15/23 0905  BP: 125/65  Pulse: 97  Height: 5\' 3"  (1.6 m)  Weight: 207 lb 3.2 oz (94 kg)  SpO2: 97%  BMI (Calculated): 36.71    Physical Exam Vitals and nursing note reviewed.  Constitutional:      Appearance: Normal appearance.  HENT:     Head: Normocephalic and atraumatic.     Nose: Nose normal.     Mouth/Throat:     Mouth: Mucous membranes are moist.     Pharynx: Oropharynx is clear.  Eyes:     Conjunctiva/sclera: Conjunctivae normal.     Pupils: Pupils are equal, round, and reactive to light.  Cardiovascular:     Rate and Rhythm: Normal rate and regular rhythm.     Pulses: Normal pulses.     Heart sounds: Normal heart sounds. No murmur heard. Pulmonary:     Effort: Pulmonary effort is normal.     Breath sounds: Normal breath sounds. No wheezing.  Abdominal:     General: Bowel sounds are normal.     Palpations: Abdomen is soft.     Tenderness: There is no abdominal tenderness. There is no right CVA tenderness or left CVA tenderness.  Musculoskeletal:        General: Normal range of motion.     Cervical back: Normal range of motion.     Right lower leg: Edema present.     Left lower leg: Edema present.  Skin:    General: Skin is warm and dry.  Neurological:     General: No focal deficit present.     Mental Status: She is alert and oriented to person, place, and time.  Psychiatric:        Mood and Affect: Mood normal.        Behavior: Behavior normal.      Results for orders placed or performed in visit on 08/15/23  POCT Urinalysis Dipstick (81002)  Result Value Ref Range   Color, UA     Clarity, UA     Glucose, UA Negative Negative   Bilirubin, UA negative    Ketones, UA negative    Spec Grav, UA 1.025 1.010 - 1.025   Blood, UA trace    pH, UA 5.5 5.0 - 8.0   Protein, UA  Negative Negative   Urobilinogen, UA 0.2 0.2 or 1.0 E.U./dL   Nitrite, UA negative    Leukocytes, UA Negative Negative   Appearance  Odor      Recent Results (from the past 2160 hour(s))  POCT Urinalysis Dipstick (84696)     Status: None   Collection Time: 08/15/23  9:51 AM  Result Value Ref Range   Color, UA     Clarity, UA     Glucose, UA Negative Negative   Bilirubin, UA negative    Ketones, UA negative    Spec Grav, UA 1.025 1.010 - 1.025   Blood, UA trace    pH, UA 5.5 5.0 - 8.0   Protein, UA Negative Negative   Urobilinogen, UA 0.2 0.2 or 1.0 E.U./dL   Nitrite, UA negative    Leukocytes, UA Negative Negative   Appearance     Odor        Assessment & Plan:  Patient advised to take her Lasix every day.  Also to start wearing her compression stockings.  Check labs today. Referral sent to orthopedic. Problem List Items Addressed This Visit     Graves' ophthalmopathy   Relevant Orders   CBC with Diff   TSH+T4F+T3Free   Mixed hyperlipidemia   Relevant Orders   Lipid Panel w/o Chol/HDL Ratio   Essential hypertension, benign   Relevant Orders   CMP14+EGFR   Other Visit Diagnoses     Osteopenia, unspecified location    -  Primary   Relevant Orders   DG Bone Density   POCT Urinalysis Dipstick (29528) (Completed)   Breast cancer screening, high risk patient       Relevant Orders   MM 3D SCREENING MAMMOGRAM BILATERAL BREAST   Pain of right hip       Relevant Orders   Ambulatory referral to Orthopedic Surgery       Return in about 2 weeks (around 08/29/2023).   Total time spent: 30 minutes  Margaretann Loveless, MD  08/15/2023   This document may have been prepared by [facility] Voice Recognition software and as such may include unintentional dictation errors.

## 2023-08-16 LAB — CMP14+EGFR
ALT: 14 IU/L (ref 0–32)
AST: 17 IU/L (ref 0–40)
Albumin: 4 g/dL (ref 3.8–4.8)
Alkaline Phosphatase: 143 IU/L — ABNORMAL HIGH (ref 44–121)
BUN/Creatinine Ratio: 13 (ref 12–28)
BUN: 13 mg/dL (ref 8–27)
Bilirubin Total: 0.9 mg/dL (ref 0.0–1.2)
CO2: 23 mmol/L (ref 20–29)
Calcium: 9.3 mg/dL (ref 8.7–10.3)
Chloride: 109 mmol/L — ABNORMAL HIGH (ref 96–106)
Creatinine, Ser: 1.03 mg/dL — ABNORMAL HIGH (ref 0.57–1.00)
Globulin, Total: 1.8 g/dL (ref 1.5–4.5)
Glucose: 103 mg/dL — ABNORMAL HIGH (ref 70–99)
Potassium: 5 mmol/L (ref 3.5–5.2)
Sodium: 145 mmol/L — ABNORMAL HIGH (ref 134–144)
Total Protein: 5.8 g/dL — ABNORMAL LOW (ref 6.0–8.5)
eGFR: 57 mL/min/{1.73_m2} — ABNORMAL LOW (ref >59)

## 2023-08-16 LAB — LIPID PANEL W/O CHOL/HDL RATIO
Cholesterol, Total: 148 mg/dL (ref 100–199)
HDL: 60 mg/dL (ref >39)
LDL Chol Calc (NIH): 76 mg/dL (ref 0–99)
Triglycerides: 59 mg/dL (ref 0–149)
VLDL Cholesterol Cal: 12 mg/dL (ref 5–40)

## 2023-08-16 LAB — CBC WITH DIFFERENTIAL/PLATELET
Basophils Absolute: 0 10*3/uL (ref 0.0–0.2)
Basos: 0 %
EOS (ABSOLUTE): 0.2 10*3/uL (ref 0.0–0.4)
Eos: 2 %
Hematocrit: 39.6 % (ref 34.0–46.6)
Hemoglobin: 13.3 g/dL (ref 11.1–15.9)
Immature Grans (Abs): 0.1 10*3/uL (ref 0.0–0.1)
Immature Granulocytes: 1 %
Lymphocytes Absolute: 1.6 10*3/uL (ref 0.7–3.1)
Lymphs: 17 %
MCH: 31 pg (ref 26.6–33.0)
MCHC: 33.6 g/dL (ref 31.5–35.7)
MCV: 92 fL (ref 79–97)
Monocytes Absolute: 0.5 10*3/uL (ref 0.1–0.9)
Monocytes: 6 %
Neutrophils Absolute: 6.7 10*3/uL (ref 1.4–7.0)
Neutrophils: 74 %
Platelets: 234 10*3/uL (ref 150–450)
RBC: 4.29 x10E6/uL (ref 3.77–5.28)
RDW: 12.8 % (ref 11.7–15.4)
WBC: 9 10*3/uL (ref 3.4–10.8)

## 2023-08-16 LAB — TSH+T4F+T3FREE
Free T4: 1.26 ng/dL (ref 0.82–1.77)
T3, Free: 2.5 pg/mL (ref 2.0–4.4)
TSH: 3.6 u[IU]/mL (ref 0.450–4.500)

## 2023-08-18 NOTE — Progress Notes (Signed)
Patient notified

## 2023-09-02 ENCOUNTER — Encounter: Payer: Self-pay | Admitting: Internal Medicine

## 2023-09-02 ENCOUNTER — Ambulatory Visit (INDEPENDENT_AMBULATORY_CARE_PROVIDER_SITE_OTHER): Payer: Medicare Other

## 2023-09-02 ENCOUNTER — Ambulatory Visit (INDEPENDENT_AMBULATORY_CARE_PROVIDER_SITE_OTHER): Payer: Medicare Other | Admitting: Internal Medicine

## 2023-09-02 VITALS — BP 124/82 | HR 71 | Ht 63.0 in | Wt 208.2 lb

## 2023-09-02 DIAGNOSIS — E890 Postprocedural hypothyroidism: Secondary | ICD-10-CM

## 2023-09-02 DIAGNOSIS — M8589 Other specified disorders of bone density and structure, multiple sites: Secondary | ICD-10-CM

## 2023-09-02 DIAGNOSIS — E0500 Thyrotoxicosis with diffuse goiter without thyrotoxic crisis or storm: Secondary | ICD-10-CM

## 2023-09-02 DIAGNOSIS — E782 Mixed hyperlipidemia: Secondary | ICD-10-CM

## 2023-09-02 DIAGNOSIS — I10 Essential (primary) hypertension: Secondary | ICD-10-CM

## 2023-09-02 DIAGNOSIS — M8580 Other specified disorders of bone density and structure, unspecified site: Secondary | ICD-10-CM

## 2023-09-02 MED ORDER — FUROSEMIDE 20 MG PO TABS: 20.0000 mg | ORAL_TABLET | ORAL | 3 refills | Status: AC | PRN

## 2023-09-02 NOTE — Progress Notes (Signed)
Established Patient Office Visit  Subjective:  Patient ID: Cindy [name], female    DOB: [birth year]  Age: 77 y.o. MRN: [id number]  Chief Complaint  Patient presents with   Follow-up    2 week follow up    Patient comes in for follow-up of her leg edema.  She is wearing compression stockings and thinks her legs are looking better but did not start the Lasix as the pills were old.  New prescription sent and patient advised just to take it on Monday Wednesdays and Fridays.  Continue wearing compression stockings and try to keep her legs elevated whenever she sits down. Labs discussed.  Mammogram has been scheduled. DEXA scan done today shows normal BMD.    No other concerns at this time.   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  Radiation tx   BREAST SURGERY Right 04/20/2012   lumpectomy   CATARACT EXTRACTION W/PHACO Left 04/06/2018   Procedure: CATARACT EXTRACTION PHACO AND INTRAOCULAR LENS PLACEMENT (IOC) LEFT COMPLICATED;  Surgeon: Lockie Mola, MD;  Location: [facility];  Service: Ophthalmology;  Laterality: Left;  HEALON 5 VISION BLUE   CATARACT EXTRACTION W/PHACO Right 06/30/2018   Procedure: CATARACT EXTRACTION PHACO AND INTRAOCULAR LENS PLACEMENT (IOC) COMPLICATED   (MALYUGIN)  RIGHT;  Surgeon: Lockie Mola, MD;  Location: [facility];  Service: Ophthalmology;  Laterality: Right;   COLONOSCOPY     EYE SURGERY     THYROIDECTOMY  08/2017   [facility] - [place]    Social History   Socioeconomic  History   Marital status: Married    Spouse name: Not on file   Number of children: Not on file   Years of education: Not on file   Highest education level: Not on file  Occupational History   Not on file  Tobacco Use   Smoking status: Never   Smokeless tobacco: Never  Vaping Use   Vaping status: Never Used  Substance and Sexual Activity   Alcohol use: No    Alcohol/week: 0.0 standard drinks of alcohol   Drug use: No   Sexual activity: Not on file  Other Topics Concern   Not on file  Social History Narrative   Not on file   Social Determinants of Health   Financial Resource Strain: Not on file  Food Insecurity: Not on file  Transportation Needs: Not on file  Physical Activity: Not on file  Stress: Not on file  Social Connections: Not on file  Intimate Partner Violence: Not on file    Family History  Problem Relation Age of Onset   Breast cancer Cousin    Breast cancer Mother        6's   Breast cancer Maternal Aunt        60's    Allergies  Allergen Reactions   Niacin Rash    Hives    Review of Systems  Constitutional: Negative.  Negative for  chills, fever, malaise/fatigue and weight loss.  HENT: Negative.    Eyes: Negative.   Respiratory: Negative.  Negative for cough and shortness of breath.   Cardiovascular: Negative.  Negative for chest pain, palpitations and leg swelling.  Gastrointestinal: Negative.  Negative for abdominal pain, constipation, diarrhea, heartburn, nausea and vomiting.  Genitourinary: Negative.  Negative for dysuria and flank pain.  Musculoskeletal: Negative.  Negative for joint pain and myalgias.  Skin: Negative.   Neurological: Negative.  Negative for dizziness and headaches.  Endo/Heme/Allergies: Negative.   Psychiatric/Behavioral: Negative.  Negative for depression and suicidal ideas. The patient is not nervous/anxious.        Objective:   BP 124/82   Pulse 71   Ht 5\' 3"  (1.6 m)   Wt 208 lb 3.2 oz (94.4 kg)   SpO2 96%    BMI 36.88 kg/m   Vitals:   09/02/23 1358  BP: 124/82  Pulse: 71  Height: 5\' 3"  (1.6 m)  Weight: 208 lb 3.2 oz (94.4 kg)  SpO2: 96%  BMI (Calculated): 36.89    Physical Exam Vitals and nursing note reviewed.  Constitutional:      Appearance: Normal appearance.  HENT:     Head: Normocephalic and atraumatic.     Nose: Nose normal.     Mouth/Throat:     Mouth: Mucous membranes are moist.     Pharynx: Oropharynx is clear.  Eyes:     Conjunctiva/sclera: Conjunctivae normal.     Pupils: Pupils are equal, round, and reactive to light.  Cardiovascular:     Rate and Rhythm: Normal rate and regular rhythm.     Pulses: Normal pulses.     Heart sounds: Normal heart sounds. No murmur heard. Pulmonary:     Effort: Pulmonary effort is normal.     Breath sounds: Normal breath sounds. No wheezing, rhonchi or rales.  Abdominal:     General: Bowel sounds are normal.     Palpations: Abdomen is soft.     Tenderness: There is no abdominal tenderness. There is no right CVA tenderness or left CVA tenderness.  Musculoskeletal:        General: Normal range of motion.     Cervical back: Normal range of motion.     Right lower leg: Edema present.     Left lower leg: Edema present.  Skin:    General: Skin is warm and dry.  Neurological:     General: No focal deficit present.     Mental Status: She is alert and oriented to person, place, and time.  Psychiatric:        Mood and Affect: Mood normal.        Behavior: Behavior normal.      No results found for any visits on 09/02/23.  Recent Results (from the past 2160 hour(s))  POCT Urinalysis Dipstick (11914)     Status: None   Collection Time: 08/15/23  9:51 AM  Result Value Ref Range   Color, UA     Clarity, UA     Glucose, UA Negative Negative   Bilirubin, UA negative    Ketones, UA negative    Spec Grav, UA 1.025 1.010 - 1.025   Blood, UA trace    pH, UA 5.5 5.0 - 8.0   Protein, UA Negative Negative   Urobilinogen, UA 0.2  0.2 or 1.0 E.U./dL   Nitrite, UA negative    Leukocytes, UA Negative Negative   Appearance     Odor    CBC  with Diff     Status: None   Collection Time: 08/15/23  9:52 AM  Result Value Ref Range   WBC 9.0 3.4 - 10.8 x10E3/uL   RBC 4.29 3.77 - 5.28 x10E6/uL   Hemoglobin 13.3 11.1 - 15.9 g/dL   Hematocrit 62.1 30.8 - 46.6 %   MCV 92 79 - 97 fL   MCH 31.0 26.6 - 33.0 pg   MCHC 33.6 31.5 - 35.7 g/dL   RDW 65.7 84.6 - 96.2 %   Platelets 234 150 - 450 x10E3/uL   Neutrophils 74 Not Estab. %   Lymphs 17 Not Estab. %   Monocytes 6 Not Estab. %   Eos 2 Not Estab. %   Basos 0 Not Estab. %   Neutrophils Absolute 6.7 1.4 - 7.0 x10E3/uL   Lymphocytes Absolute 1.6 0.7 - 3.1 x10E3/uL   Monocytes Absolute 0.5 0.1 - 0.9 x10E3/uL   EOS (ABSOLUTE) 0.2 0.0 - 0.4 x10E3/uL   Basophils Absolute 0.0 0.0 - 0.2 x10E3/uL   Immature Granulocytes 1 Not Estab. %   Immature Grans (Abs) 0.1 0.0 - 0.1 x10E3/uL  Lipid Panel w/o Chol/HDL Ratio     Status: None   Collection Time: 08/15/23  9:52 AM  Result Value Ref Range   Cholesterol, Total 148 100 - 199 mg/dL   Triglycerides 59 0 - 149 mg/dL   HDL 60 >95 mg/dL   VLDL Cholesterol Cal 12 5 - 40 mg/dL   LDL Chol Calc (NIH) 76 0 - 99 mg/dL  MWU+[id number]     Status: None   Collection Time: 08/15/23  9:52 AM  Result Value Ref Range   TSH 3.600 0.450 - 4.500 uIU/mL   T3, Free 2.5 2.0 - 4.4 pg/mL   Free T4 1.26 0.82 - 1.77 ng/dL  [id number]     Status: Abnormal   Collection Time: 08/15/23  9:52 AM  Result Value Ref Range   Glucose 103 (H) 70 - 99 mg/dL   BUN 13 8 - 27 mg/dL   Creatinine, Ser 3.47 (H) 0.57 - 1.00 mg/dL   eGFR 57 (L) >42 VZ/DGL/8.75   BUN/Creatinine Ratio 13 12 - 28   Sodium 145 (H) 134 - 144 mmol/L   Potassium 5.0 3.5 - 5.2 mmol/L   Chloride 109 (H) 96 - 106 mmol/L   CO2 23 20 - 29 mmol/L   Calcium 9.3 8.7 - 10.3 mg/dL   Total Protein 5.8 (L) 6.0 - 8.5 g/dL   Albumin 4.0 3.8 - 4.8 g/dL   Globulin, Total 1.8 1.5 - 4.5 g/dL    Bilirubin Total 0.9 0.0 - 1.2 mg/dL   Alkaline Phosphatase 143 (H) 44 - 121 IU/L   AST 17 0 - 40 IU/L   ALT 14 0 - 32 IU/L      Assessment & Plan:  Patient will continue all her medications. Problem List Items Addressed This Visit     Graves' ophthalmopathy   Mixed hyperlipidemia   Relevant Medications   furosemide (LASIX) 20 MG tablet   Essential hypertension, benign - Primary   Relevant Medications   furosemide (LASIX) 20 MG tablet   Postoperative hypothyroidism    Return in about 1 month (around 10/02/2023).   Total time spent: 30 minutes  Margaretann Loveless, MD  09/02/2023   This document may have been prepared by [facility] Voice Recognition software and as such may include unintentional dictation errors.

## 2023-09-08 ENCOUNTER — Ambulatory Visit
Admission: RE | Admit: 2023-09-08 | Discharge: 2023-09-08 | Disposition: A | Discharge status: Home / Self Care | Payer: Medicare Other | Source: Ambulatory Visit | Attending: Internal Medicine | Admitting: Internal Medicine

## 2023-09-08 DIAGNOSIS — Z1231 Encounter for screening mammogram for malignant neoplasm of breast: Secondary | ICD-10-CM | POA: Diagnosis not present

## 2023-09-08 DIAGNOSIS — Z1239 Encounter for other screening for malignant neoplasm of breast: Secondary | ICD-10-CM

## 2023-09-15 DIAGNOSIS — M1611 Unilateral primary osteoarthritis, right hip: Secondary | ICD-10-CM | POA: Diagnosis not present

## 2023-09-26 ENCOUNTER — Other Ambulatory Visit: Payer: Self-pay | Admitting: Internal Medicine

## 2023-10-02 ENCOUNTER — Ambulatory Visit: Payer: Medicare Other | Admitting: Internal Medicine

## 2023-10-09 DIAGNOSIS — H40003 Preglaucoma, unspecified, bilateral: Secondary | ICD-10-CM | POA: Diagnosis not present

## 2023-10-09 DIAGNOSIS — E0500 Thyrotoxicosis with diffuse goiter without thyrotoxic crisis or storm: Secondary | ICD-10-CM | POA: Diagnosis not present

## 2023-10-09 DIAGNOSIS — Z961 Presence of intraocular lens: Secondary | ICD-10-CM | POA: Diagnosis not present

## 2023-10-10 ENCOUNTER — Ambulatory Visit: Payer: Medicare Other

## 2023-10-10 DIAGNOSIS — Z23 Encounter for immunization: Secondary | ICD-10-CM | POA: Diagnosis not present

## 2023-10-10 DIAGNOSIS — Z719 Counseling, unspecified: Secondary | ICD-10-CM

## 2023-10-10 NOTE — Progress Notes (Signed)
Client seen in nurse clinic for COVID-19 vaccine.  Discussed getting flu vaccine as well.  Client decided to wait and get the flu vaccine at a different visit.  Vaccine administered and tolerated well.  Client waited 15 minutes post vaccine with no issues.  Clara Smolen Sherrilyn Rist, RN

## 2023-10-14 DIAGNOSIS — Z0189 Encounter for other specified special examinations: Secondary | ICD-10-CM | POA: Diagnosis not present

## 2023-10-14 DIAGNOSIS — Z01818 Encounter for other preprocedural examination: Secondary | ICD-10-CM | POA: Diagnosis not present

## 2023-10-19 ENCOUNTER — Other Ambulatory Visit: Payer: Self-pay | Admitting: Family

## 2023-10-19 DIAGNOSIS — E782 Mixed hyperlipidemia: Secondary | ICD-10-CM

## 2023-11-01 ENCOUNTER — Other Ambulatory Visit: Payer: Self-pay | Admitting: Cardiovascular Disease

## 2023-11-11 ENCOUNTER — Ambulatory Visit (INDEPENDENT_AMBULATORY_CARE_PROVIDER_SITE_OTHER): Payer: Medicare Other | Admitting: Internal Medicine

## 2023-11-11 ENCOUNTER — Encounter: Payer: Self-pay | Admitting: Internal Medicine

## 2023-11-11 VITALS — BP 110/68 | HR 81 | Ht 63.0 in | Wt 204.2 lb

## 2023-11-11 DIAGNOSIS — R739 Hyperglycemia, unspecified: Secondary | ICD-10-CM

## 2023-11-11 DIAGNOSIS — Z0000 Encounter for general adult medical examination without abnormal findings: Secondary | ICD-10-CM

## 2023-11-11 DIAGNOSIS — J069 Acute upper respiratory infection, unspecified: Secondary | ICD-10-CM | POA: Diagnosis not present

## 2023-11-11 DIAGNOSIS — E890 Postprocedural hypothyroidism: Secondary | ICD-10-CM | POA: Diagnosis not present

## 2023-11-11 DIAGNOSIS — E782 Mixed hyperlipidemia: Secondary | ICD-10-CM | POA: Diagnosis not present

## 2023-11-11 DIAGNOSIS — I10 Essential (primary) hypertension: Secondary | ICD-10-CM | POA: Diagnosis not present

## 2023-11-11 LAB — POCT XPERT XPRESS SARS COVID-2/FLU/RSV
FLU A: NEGATIVE
FLU B: NEGATIVE
RSV RNA, PCR: NEGATIVE
SARS Coronavirus 2: NEGATIVE

## 2023-11-11 NOTE — Progress Notes (Signed)
Established Patient Office Visit  Subjective:  Patient ID: Cindy [name], female    DOB: Mar 29, 1946  Age: 77 y.o. MRN: [id number]  Chief Complaint  Patient presents with   Annual Exam    AWV    Patient comes in for her annual wellness visit.  She is generally feeling well except for some arthritic aches and pains.  Occasionally feels a little anxious but does not need to be on any medication at this time.  Her labs are due later on this month. Mammogram done in 08/2023. DEXA 08/2023 Colonoscopy-2015. Today feels  mild sore throat, COVID/flu/RSV is negative. Will return for a flu vaccine next week. PHQ-9/GAD score is 4/2. CFS score is 0.    No other concerns at this time.   Past Medical History:  Diagnosis Date   Breast cancer 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  Radiation tx   BREAST SURGERY Right 04/20/2012   lumpectomy   CATARACT EXTRACTION W/PHACO Left 04/06/2018   Procedure: CATARACT EXTRACTION PHACO AND INTRAOCULAR LENS PLACEMENT (IOC) LEFT COMPLICATED;  Surgeon: Lockie Mola, MD;  Location: [facility];  Service: Ophthalmology;  Laterality: Left;  HEALON 5 VISION BLUE   CATARACT EXTRACTION W/PHACO Right 06/30/2018   Procedure: CATARACT EXTRACTION PHACO AND INTRAOCULAR LENS PLACEMENT (IOC) COMPLICATED   (MALYUGIN)  RIGHT;  Surgeon: Lockie Mola, MD;  Location: [facility];  Service: Ophthalmology;  Laterality: Right;   COLONOSCOPY     EYE SURGERY     THYROIDECTOMY  08/2017   [facility] - [place]    Social History   Socioeconomic History    Marital status: Married    Spouse name: Not on file   Number of children: Not on file   Years of education: Not on file   Highest education level: Not on file  Occupational History   Not on file  Tobacco Use   Smoking status: Never   Smokeless tobacco: Never  Vaping Use   Vaping status: Never Used  Substance and Sexual Activity   Alcohol use: No    Alcohol/week: 0.0 standard drinks of alcohol   Drug use: No   Sexual activity: Not on file  Other Topics Concern   Not on file  Social History Narrative   Not on file   Social Determinants of Health   Financial Resource Strain: Low Risk  (11/11/2023)   Overall Financial Resource Strain (CARDIA)    Difficulty of Paying Living Expenses: Not hard at all  Food Insecurity: No Food Insecurity (11/11/2023)   Hunger Vital Sign    Worried About Running Out of Food in the Last Year: Never true    Ran Out of Food in the Last Year: Never true  Transportation Needs: No Transportation Needs (11/11/2023)   PRAPARE - Administrator, Civil Service (Medical): No    Lack of Transportation (Non-Medical): No  Physical Activity: Insufficiently Active (11/11/2023)   Exercise Vital Sign    Days of Exercise per Week: 3  days    Minutes of Exercise per Session: 20 min  Stress: Not on file  Social Connections: Not on file  Intimate Partner Violence: Not At Risk (11/11/2023)   Humiliation, Afraid, Rape, and Kick questionnaire    Fear of Current or Ex-Partner: No    Emotionally Abused: No    Physically Abused: No    Sexually Abused: No    Family History  Problem Relation Age of Onset   Breast cancer Cousin    Breast cancer Mother        20's   Breast cancer Maternal Aunt        60's    Allergies  Allergen Reactions   Niacin Rash    Hives    Review of Systems  Constitutional: Negative.  Negative for chills, diaphoresis, fever, malaise/fatigue and weight loss.  HENT:  Positive for sore throat. Negative for congestion, ear  discharge, ear pain, hearing loss, nosebleeds, sinus pain and tinnitus.   Eyes: Negative.  Negative for blurred vision, double vision, pain, discharge and redness.  Respiratory: Negative.  Negative for cough and shortness of breath.   Cardiovascular: Negative.  Negative for chest pain, palpitations and leg swelling.  Gastrointestinal: Negative.  Negative for abdominal pain, constipation, diarrhea, heartburn, nausea and vomiting.  Genitourinary: Negative.  Negative for dysuria and flank pain.  Musculoskeletal:  Positive for joint pain. Negative for back pain, myalgias and neck pain.  Skin: Negative.   Neurological:  Positive for speech change. Negative for dizziness, tingling, tremors, sensory change and headaches.  Endo/Heme/Allergies: Negative.   Psychiatric/Behavioral: Negative.  Negative for depression and suicidal ideas. The patient is not nervous/anxious.        Objective:   BP 110/68   Pulse 81   Ht 5\' 3"  (1.6 m)   Wt 204 lb 3.2 oz (92.6 kg)   SpO2 98%   BMI 36.17 kg/m   Vitals:   11/11/23 0933  BP: 110/68  Pulse: 81  Height: 5\' 3"  (1.6 m)  Weight: 204 lb 3.2 oz (92.6 kg)  SpO2: 98%  BMI (Calculated): 36.18    Physical Exam Vitals and nursing note reviewed.  Constitutional:      General: She is not in acute distress.    Appearance: Normal appearance.  HENT:     Head: Normocephalic and atraumatic.     Right Ear: Tympanic membrane normal.     Left Ear: Tympanic membrane normal.     Nose: Nose normal.     Mouth/Throat:     Mouth: Mucous membranes are moist.     Pharynx: Oropharynx is clear. No oropharyngeal exudate or posterior oropharyngeal erythema.  Eyes:     Conjunctiva/sclera: Conjunctivae normal.     Pupils: Pupils are equal, round, and reactive to light.  Cardiovascular:     Rate and Rhythm: Normal rate and regular rhythm.     Pulses: Normal pulses.     Heart sounds: Normal heart sounds. No murmur heard. Pulmonary:     Effort: Pulmonary effort is  normal.     Breath sounds: Normal breath sounds. No wheezing.  Abdominal:     General: Bowel sounds are normal.     Palpations: Abdomen is soft.     Tenderness: There is no abdominal tenderness. There is no right CVA tenderness or left CVA tenderness.  Musculoskeletal:        General: Normal range of motion.     Cervical back: Normal range of motion.     Right lower leg: No edema.  Left lower leg: No edema.  Skin:    General: Skin is warm and dry.     Findings: No lesion.  Neurological:     General: No focal deficit present.     Mental Status: She is alert and oriented to person, place, and time.  Psychiatric:        Mood and Affect: Mood normal.        Behavior: Behavior normal.     Results for orders placed or performed in visit on 11/11/23  POCT XPERT XPRESS SARS COVID-2/FLU/RSV [[id number]]  Result Value Ref Range   SARS Coronavirus 2 negative    FLU A negative    FLU B negative    RSV RNA, PCR negative     Recent Results (from the past 2160 hour(s))  POCT Urinalysis Dipstick (78295)     Status: None   Collection Time: 08/15/23  9:51 AM  Result Value Ref Range   Color, UA     Clarity, UA     Glucose, UA Negative Negative   Bilirubin, UA negative    Ketones, UA negative    Spec Grav, UA 1.025 1.010 - 1.025   Blood, UA trace    pH, UA 5.5 5.0 - 8.0   Protein, UA Negative Negative   Urobilinogen, UA 0.2 0.2 or 1.0 E.U./dL   Nitrite, UA negative    Leukocytes, UA Negative Negative   Appearance     Odor    CBC with Diff     Status: None   Collection Time: 08/15/23  9:52 AM  Result Value Ref Range   WBC 9.0 3.4 - 10.8 x10E3/uL   RBC 4.29 3.77 - 5.28 x10E6/uL   Hemoglobin 13.3 11.1 - 15.9 g/dL   Hematocrit 62.1 30.8 - 46.6 %   MCV 92 79 - 97 fL   MCH 31.0 26.6 - 33.0 pg   MCHC 33.6 31.5 - 35.7 g/dL   RDW 65.7 84.6 - 96.2 %   Platelets 234 150 - 450 x10E3/uL   Neutrophils 74 Not Estab. %   Lymphs 17 Not Estab. %   Monocytes 6 Not Estab. %   Eos 2 Not  Estab. %   Basos 0 Not Estab. %   Neutrophils Absolute 6.7 1.4 - 7.0 x10E3/uL   Lymphocytes Absolute 1.6 0.7 - 3.1 x10E3/uL   Monocytes Absolute 0.5 0.1 - 0.9 x10E3/uL   EOS (ABSOLUTE) 0.2 0.0 - 0.4 x10E3/uL   Basophils Absolute 0.0 0.0 - 0.2 x10E3/uL   Immature Granulocytes 1 Not Estab. %   Immature Grans (Abs) 0.1 0.0 - 0.1 x10E3/uL  Lipid Panel w/o Chol/HDL Ratio     Status: None   Collection Time: 08/15/23  9:52 AM  Result Value Ref Range   Cholesterol, Total 148 100 - 199 mg/dL   Triglycerides 59 0 - 149 mg/dL   HDL 60 >95 mg/dL   VLDL Cholesterol Cal 12 5 - 40 mg/dL   LDL Chol Calc (NIH) 76 0 - 99 mg/dL  MWU+[id number]+[id number]     Status: None   Collection Time: 08/15/23  9:52 AM  Result Value Ref Range   TSH 3.600 0.450 - 4.500 uIU/mL   T3, Free 2.5 2.0 - 4.4 pg/mL   Free T4 1.26 0.82 - 1.77 ng/dL  VOZ36+[id number]     Status: Abnormal   Collection Time: 08/15/23  9:52 AM  Result Value Ref Range   Glucose 103 (H) 70 - 99 mg/dL   BUN 13 8 - 27 mg/dL  Creatinine, Ser 1.03 (H) 0.57 - 1.00 mg/dL   eGFR 57 (L) >81 XB/JYN/8.29   BUN/Creatinine Ratio 13 12 - 28   Sodium 145 (H) 134 - 144 mmol/L   Potassium 5.0 3.5 - 5.2 mmol/L   Chloride 109 (H) 96 - 106 mmol/L   CO2 23 20 - 29 mmol/L   Calcium 9.3 8.7 - 10.3 mg/dL   Total Protein 5.8 (L) 6.0 - 8.5 g/dL   Albumin 4.0 3.8 - 4.8 g/dL   Globulin, Total 1.8 1.5 - 4.5 g/dL   Bilirubin Total 0.9 0.0 - 1.2 mg/dL   Alkaline Phosphatase 143 (H) 44 - 121 IU/L   AST 17 0 - 40 IU/L   ALT 14 0 - 32 IU/L  POCT XPERT XPRESS SARS COVID-2/FLU/RSV [[id number]]     Status: Normal   Collection Time: 11/11/23 10:28 AM  Result Value Ref Range   SARS Coronavirus 2 negative    FLU A negative    FLU B negative    RSV RNA, PCR negative       Assessment & Plan:  Advised to continue all medications. Rest fluids and Tylenol as needed for now. Fasting labs in a few weeks. Problem List Items Addressed This Visit     Mixed hyperlipidemia   Relevant  Orders   Lipid Panel w/o Chol/HDL Ratio   Essential hypertension, benign   Relevant Orders   CMP14+EGFR   Postoperative hypothyroidism   Relevant Orders   TSH+T4F+T3Free   Other Visit Diagnoses     Annual wellness visit    -  Primary   Elevated random blood glucose level       Relevant Orders   Hemoglobin A1c   Viral upper respiratory tract infection       Relevant Orders   POCT XPERT XPRESS SARS COVID-2/FLU/RSV [[id number]] (Completed)       Return in about 2 months (around 01/11/2024).   Total time spent: 30 minutes  Margaretann Loveless, MD  11/11/2023   This document may have been prepared by [facility] Voice Recognition software and as such may include unintentional dictation errors.

## 2023-11-14 ENCOUNTER — Other Ambulatory Visit: Payer: Self-pay | Admitting: Cardiovascular Disease

## 2023-11-14 DIAGNOSIS — R609 Edema, unspecified: Secondary | ICD-10-CM

## 2023-11-14 DIAGNOSIS — I351 Nonrheumatic aortic (valve) insufficiency: Secondary | ICD-10-CM

## 2023-11-18 DIAGNOSIS — M1611 Unilateral primary osteoarthritis, right hip: Secondary | ICD-10-CM | POA: Diagnosis not present

## 2023-11-20 DIAGNOSIS — Z853 Personal history of malignant neoplasm of breast: Secondary | ICD-10-CM | POA: Diagnosis not present

## 2023-11-20 DIAGNOSIS — Z7982 Long term (current) use of aspirin: Secondary | ICD-10-CM | POA: Diagnosis not present

## 2023-11-20 DIAGNOSIS — E039 Hypothyroidism, unspecified: Secondary | ICD-10-CM | POA: Diagnosis not present

## 2023-11-20 DIAGNOSIS — Z23 Encounter for immunization: Secondary | ICD-10-CM | POA: Diagnosis not present

## 2023-11-20 DIAGNOSIS — M1611 Unilateral primary osteoarthritis, right hip: Secondary | ICD-10-CM | POA: Diagnosis not present

## 2023-11-20 DIAGNOSIS — I10 Essential (primary) hypertension: Secondary | ICD-10-CM | POA: Diagnosis not present

## 2023-11-21 ENCOUNTER — Ambulatory Visit: Payer: Medicare Other | Admitting: Cardiovascular Disease

## 2023-11-21 DIAGNOSIS — M1611 Unilateral primary osteoarthritis, right hip: Secondary | ICD-10-CM | POA: Diagnosis not present

## 2023-11-21 DIAGNOSIS — Z853 Personal history of malignant neoplasm of breast: Secondary | ICD-10-CM | POA: Diagnosis not present

## 2023-11-21 DIAGNOSIS — Z7982 Long term (current) use of aspirin: Secondary | ICD-10-CM | POA: Diagnosis not present

## 2023-11-21 DIAGNOSIS — E039 Hypothyroidism, unspecified: Secondary | ICD-10-CM | POA: Diagnosis not present

## 2023-11-21 DIAGNOSIS — I10 Essential (primary) hypertension: Secondary | ICD-10-CM | POA: Diagnosis not present

## 2023-11-21 DIAGNOSIS — Z23 Encounter for immunization: Secondary | ICD-10-CM | POA: Diagnosis not present

## 2023-11-23 ENCOUNTER — Other Ambulatory Visit: Payer: Self-pay | Admitting: Internal Medicine

## 2023-11-23 DIAGNOSIS — G8929 Other chronic pain: Secondary | ICD-10-CM

## 2023-11-26 DIAGNOSIS — M25651 Stiffness of right hip, not elsewhere classified: Secondary | ICD-10-CM | POA: Diagnosis not present

## 2023-11-26 DIAGNOSIS — M25551 Pain in right hip: Secondary | ICD-10-CM | POA: Diagnosis not present

## 2023-12-02 DIAGNOSIS — M25551 Pain in right hip: Secondary | ICD-10-CM | POA: Diagnosis not present

## 2023-12-02 DIAGNOSIS — M25651 Stiffness of right hip, not elsewhere classified: Secondary | ICD-10-CM | POA: Diagnosis not present

## 2023-12-05 DIAGNOSIS — M25551 Pain in right hip: Secondary | ICD-10-CM | POA: Diagnosis not present

## 2023-12-05 DIAGNOSIS — M25651 Stiffness of right hip, not elsewhere classified: Secondary | ICD-10-CM | POA: Diagnosis not present

## 2023-12-09 DIAGNOSIS — M25651 Stiffness of right hip, not elsewhere classified: Secondary | ICD-10-CM | POA: Diagnosis not present

## 2023-12-09 DIAGNOSIS — M25551 Pain in right hip: Secondary | ICD-10-CM | POA: Diagnosis not present

## 2023-12-16 DIAGNOSIS — M25651 Stiffness of right hip, not elsewhere classified: Secondary | ICD-10-CM | POA: Diagnosis not present

## 2023-12-16 DIAGNOSIS — M25551 Pain in right hip: Secondary | ICD-10-CM | POA: Diagnosis not present

## 2023-12-19 DIAGNOSIS — M25551 Pain in right hip: Secondary | ICD-10-CM | POA: Diagnosis not present

## 2023-12-19 DIAGNOSIS — M25651 Stiffness of right hip, not elsewhere classified: Secondary | ICD-10-CM | POA: Diagnosis not present

## 2023-12-25 DIAGNOSIS — M25651 Stiffness of right hip, not elsewhere classified: Secondary | ICD-10-CM | POA: Diagnosis not present

## 2023-12-25 DIAGNOSIS — M25551 Pain in right hip: Secondary | ICD-10-CM | POA: Diagnosis not present

## 2023-12-30 DIAGNOSIS — M25551 Pain in right hip: Secondary | ICD-10-CM | POA: Diagnosis not present

## 2023-12-30 DIAGNOSIS — M25651 Stiffness of right hip, not elsewhere classified: Secondary | ICD-10-CM | POA: Diagnosis not present

## 2024-01-05 DIAGNOSIS — Z96641 Presence of right artificial hip joint: Secondary | ICD-10-CM | POA: Diagnosis not present

## 2024-01-12 ENCOUNTER — Encounter: Payer: Self-pay | Admitting: Internal Medicine

## 2024-01-12 ENCOUNTER — Ambulatory Visit (INDEPENDENT_AMBULATORY_CARE_PROVIDER_SITE_OTHER): Payer: Medicare Other | Admitting: Internal Medicine

## 2024-01-12 VITALS — BP 118/70 | HR 77 | Ht 63.0 in | Wt 190.8 lb

## 2024-01-12 DIAGNOSIS — I10 Essential (primary) hypertension: Secondary | ICD-10-CM | POA: Diagnosis not present

## 2024-01-12 DIAGNOSIS — E538 Deficiency of other specified B group vitamins: Secondary | ICD-10-CM

## 2024-01-12 DIAGNOSIS — E782 Mixed hyperlipidemia: Secondary | ICD-10-CM | POA: Diagnosis not present

## 2024-01-12 DIAGNOSIS — E559 Vitamin D deficiency, unspecified: Secondary | ICD-10-CM | POA: Diagnosis not present

## 2024-01-12 NOTE — Progress Notes (Signed)
 Established Patient Office Visit  Subjective:  Patient ID: Cindy [name], female    DOB: 1946/10/24  Age: 78 y.o. MRN: [id number]  Chief Complaint  Patient presents with   Follow-up    2 month follow up    Patient is here for her follow-up today.  She is generally feeling well and has no new complaints.  She has lost some weight as she was unable to eat during her dental work.  Advised to continue diet control. Will get labs today.    No other concerns at this time.   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  Radiation tx   BREAST SURGERY Right 04/20/2012   lumpectomy   CATARACT EXTRACTION W/PHACO Left 04/06/2018   Procedure: CATARACT EXTRACTION PHACO AND INTRAOCULAR LENS PLACEMENT (IOC) LEFT COMPLICATED;  Surgeon: Mittie Gaskin, MD;  Location: [facility];  Service: Ophthalmology;  Laterality: Left;  HEALON 5 VISION BLUE   CATARACT EXTRACTION W/PHACO Right 06/30/2018   Procedure: CATARACT EXTRACTION PHACO AND INTRAOCULAR LENS PLACEMENT (IOC) COMPLICATED   (MALYUGIN)  RIGHT;  Surgeon: Mittie Gaskin, MD;  Location: [facility];  Service: Ophthalmology;  Laterality: Right;   COLONOSCOPY     EYE SURGERY     THYROIDECTOMY  08/2017   [facility] - [place]    Social History   Socioeconomic History   Marital status: Married    Spouse name: Not on file   Number of children: Not on file   Years of education: Not on file   Highest education level: Not on file  Occupational History   Not on file  Tobacco Use    Smoking status: Never   Smokeless tobacco: Never  Vaping Use   Vaping status: Never Used  Substance and Sexual Activity   Alcohol use: No    Alcohol/week: 0.0 standard drinks of alcohol   Drug use: No   Sexual activity: Not on file  Other Topics Concern   Not on file  Social History Narrative   Not on file   Social Drivers of Health   Financial Resource Strain: Low Risk  (11/11/2023)   Overall Financial Resource Strain (CARDIA)    Difficulty of Paying Living Expenses: Not hard at all  Food Insecurity: No Food Insecurity (11/11/2023)   Hunger Vital Sign    Worried About Running Out of Food in the Last Year: Never true    Ran Out of Food in the Last Year: Never true  Transportation Needs: No Transportation Needs (11/11/2023)   PRAPARE - Administrator, Civil Service (Medical): No    Lack of Transportation (Non-Medical): No  Physical Activity: Insufficiently Active (11/11/2023)   Exercise Vital Sign    Days of Exercise per Week: 3 days    Minutes of Exercise per Session: 20 min  Stress: Not on file  Social Connections: Not on file  Intimate Partner Violence: Not At Risk (11/11/2023)   Humiliation, Afraid,  Rape, and Kick questionnaire    Fear of Current or Ex-Partner: No    Emotionally Abused: No    Physically Abused: No    Sexually Abused: No    Family History  Problem Relation Age of Onset   Breast cancer Cousin    Breast cancer Mother        21's   Breast cancer Maternal Aunt        60's    Allergies  Allergen Reactions   Niacin Rash    Hives    Outpatient Medications Prior to Visit  Medication Sig   acetaminophen (TYLENOL) 500 MG tablet Take 500 mg by mouth every 6 (six) hours as needed.   atorvastatin (LIPITOR) 20 MG tablet TAKE 1 TABLET BY MOUTH EVERY DAY   bimatoprost (LUMIGAN) 0.03 % ophthalmic solution Place 1 drop into both eyes at bedtime.   brimonidine -timolol  (COMBIGAN ) 0.2-0.5 % ophthalmic solution Place 1 drop into both eyes every  12 (twelve) hours.   brinzolamide (AZOPT) 1 % ophthalmic suspension Place 1 drop into both eyes 2 (two) times daily.   Cholecalciferol (VITAMIN D3) 1.25 MG (50000 UT) CAPS TAKE 1 CAPSULE BY MOUTH ONCE WEEKLY   furosemide  (LASIX ) 20 MG tablet Take 1 tablet (20 mg total) by mouth as needed for edema.   levothyroxine (SYNTHROID) 75 MCG tablet Take 75 mcg by mouth daily before breakfast.   lisinopril (ZESTRIL) 5 MG tablet TAKE 1 TABLET BY MOUTH EVERY DAY   metoprolol succinate (TOPROL-XL) 25 MG 24 hr tablet TAKE 1/2 TABLET BY MOUTH DAILY   spironolactone (ALDACTONE) 25 MG tablet TAKE 1/2 TABLET BY MOUTH DAILY   meloxicam (MOBIC) 15 MG tablet TAKE 1 TABLET BY MOUTH ONCE DAILY WITH FOOD. (Patient not taking: Reported on 01/12/2024)   No facility-administered medications prior to visit.    Review of Systems  Constitutional: Negative.  Negative for chills, fever and weight loss.  HENT: Negative.    Eyes: Negative.   Respiratory: Negative.  Negative for cough and shortness of breath.   Cardiovascular: Negative.  Negative for chest pain, palpitations and leg swelling.  Gastrointestinal: Negative.  Negative for abdominal pain, constipation, diarrhea, heartburn, nausea and vomiting.  Genitourinary: Negative.  Negative for dysuria and flank pain.  Musculoskeletal: Negative.  Negative for joint pain and myalgias.  Skin: Negative.   Neurological: Negative.  Negative for dizziness, tingling, tremors, sensory change and headaches.  Endo/Heme/Allergies: Negative.   Psychiatric/Behavioral: Negative.  Negative for depression and suicidal ideas. The patient is not nervous/anxious.        Objective:   BP 118/70   Pulse 77   Ht 5' 3 (1.6 m)   Wt 190 lb 12.8 oz (86.5 kg)   SpO2 99%   BMI 33.80 kg/m   Vitals:   01/12/24 0954  BP: 118/70  Pulse: 77  Height: 5' 3 (1.6 m)  Weight: 190 lb 12.8 oz (86.5 kg)  SpO2: 99%  BMI (Calculated): 33.81    Physical Exam Vitals and nursing note reviewed.   Constitutional:      Appearance: Normal appearance.  HENT:     Head: Normocephalic and atraumatic.     Nose: Nose normal.     Mouth/Throat:     Mouth: Mucous membranes are moist.     Pharynx: Oropharynx is clear.  Eyes:     Conjunctiva/sclera: Conjunctivae normal.     Pupils: Pupils are equal, round, and reactive to light.  Cardiovascular:     Rate and Rhythm: Normal rate and regular  rhythm.     Pulses: Normal pulses.     Heart sounds: Normal heart sounds. No murmur heard. Pulmonary:     Effort: Pulmonary effort is normal.     Breath sounds: Normal breath sounds. No wheezing.  Abdominal:     General: Bowel sounds are normal.     Palpations: Abdomen is soft.     Tenderness: There is no abdominal tenderness. There is no right CVA tenderness or left CVA tenderness.  Musculoskeletal:        General: Normal range of motion.     Cervical back: Normal range of motion.     Right lower leg: No edema.     Left lower leg: No edema.  Skin:    General: Skin is warm and dry.  Neurological:     General: No focal deficit present.     Mental Status: She is alert and oriented to person, place, and time.  Psychiatric:        Mood and Affect: Mood normal.        Behavior: Behavior normal.      No results found for any visits on 01/12/24.  Recent Results (from the past 2160 hours)  POCT XPERT XPRESS SARS COVID-2/FLU/RSV [[id number]]     Status: Normal   Collection Time: 11/11/23 10:28 AM  Result Value Ref Range   SARS Coronavirus 2 negative    FLU A negative    FLU B negative    RSV RNA, PCR negative       Assessment & Plan:  Continue all medications.  Exercise and diet control.  Check labs today. Problem List Items Addressed This Visit     Vitamin B12 deficiency   Relevant Orders   Vitamin B12   Mixed hyperlipidemia   Relevant Orders   Lipid Panel w/o Chol/HDL Ratio   Essential hypertension, benign - Primary   Relevant Orders   CMP14+EGFR   Vitamin D  deficiency    Relevant Orders   Vitamin D  (25 hydroxy)    Return in about 4 months (around 05/11/2024).   Total time spent: 30 minutes  FERNAND FREDY RAMAN, MD  01/12/2024   This document may have been prepared by [facility] Voice Recognition software and as such may include unintentional dictation errors.

## 2024-01-13 LAB — CMP14+EGFR
ALT: 9 [IU]/L (ref 0–32)
AST: 14 [IU]/L (ref 0–40)
Albumin: 3.9 g/dL (ref 3.8–4.8)
Alkaline Phosphatase: 146 [IU]/L — ABNORMAL HIGH (ref 44–121)
BUN/Creatinine Ratio: 11 — ABNORMAL LOW (ref 12–28)
BUN: 8 mg/dL (ref 8–27)
Bilirubin Total: 0.6 mg/dL (ref 0.0–1.2)
CO2: 26 mmol/L (ref 20–29)
Calcium: 9.2 mg/dL (ref 8.7–10.3)
Chloride: 105 mmol/L (ref 96–106)
Creatinine, Ser: 0.74 mg/dL (ref 0.57–1.00)
Globulin, Total: 2.1 g/dL (ref 1.5–4.5)
Glucose: 92 mg/dL (ref 70–99)
Potassium: 4.3 mmol/L (ref 3.5–5.2)
Sodium: 145 mmol/L — ABNORMAL HIGH (ref 134–144)
Total Protein: 6 g/dL (ref 6.0–8.5)
eGFR: 84 mL/min/{1.73_m2} (ref >59)

## 2024-01-13 LAB — LIPID PANEL W/O CHOL/HDL RATIO
Cholesterol, Total: 164 mg/dL (ref 100–199)
HDL: 59 mg/dL (ref >39)
LDL Chol Calc (NIH): 90 mg/dL (ref 0–99)
Triglycerides: 78 mg/dL (ref 0–149)
VLDL Cholesterol Cal: 15 mg/dL (ref 5–40)

## 2024-01-13 LAB — VITAMIN B12: Vitamin B-12: 228 pg/mL — ABNORMAL LOW (ref 232–1245)

## 2024-01-13 LAB — VITAMIN D 25 HYDROXY (VIT D DEFICIENCY, FRACTURES): Vit D, 25-Hydroxy: 100 ng/mL (ref 30.0–100.0)

## 2024-01-16 ENCOUNTER — Encounter: Payer: Self-pay | Admitting: Internal Medicine

## 2024-01-16 ENCOUNTER — Ambulatory Visit (INDEPENDENT_AMBULATORY_CARE_PROVIDER_SITE_OTHER): Payer: Medicare Other | Admitting: Internal Medicine

## 2024-01-16 DIAGNOSIS — E538 Deficiency of other specified B group vitamins: Secondary | ICD-10-CM | POA: Diagnosis not present

## 2024-01-16 MED ORDER — CYANOCOBALAMIN 1000 MCG/ML IJ SOLN
1000.0000 ug | Freq: Once | INTRAMUSCULAR | Status: AC
  Administered 2024-01-16: 1000 ug via INTRAMUSCULAR

## 2024-01-16 NOTE — Progress Notes (Signed)
Vit B12 inj only-

## 2024-01-19 DIAGNOSIS — M25551 Pain in right hip: Secondary | ICD-10-CM | POA: Diagnosis not present

## 2024-01-19 DIAGNOSIS — M25651 Stiffness of right hip, not elsewhere classified: Secondary | ICD-10-CM | POA: Diagnosis not present

## 2024-01-23 ENCOUNTER — Encounter: Payer: Self-pay | Admitting: Internal Medicine

## 2024-01-23 ENCOUNTER — Ambulatory Visit (INDEPENDENT_AMBULATORY_CARE_PROVIDER_SITE_OTHER): Payer: Medicare Other | Admitting: Internal Medicine

## 2024-01-23 DIAGNOSIS — E538 Deficiency of other specified B group vitamins: Secondary | ICD-10-CM

## 2024-01-23 MED ORDER — CYANOCOBALAMIN 1000 MCG/ML IJ SOLN
1000.0000 ug | Freq: Once | INTRAMUSCULAR | Status: AC
  Administered 2024-01-23: 1000 ug via INTRAMUSCULAR

## 2024-01-23 NOTE — Progress Notes (Signed)
Established Patient Office Visit  Subjective:  Patient ID: Cindy [name], female    DOB: [birth year]  Age: 78 y.o. MRN: [id number]  No chief complaint on file.   Pt  in for Vit B12 inj only-    No other concerns at this time.   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  Radiation tx   BREAST SURGERY Right 04/20/2012   lumpectomy   CATARACT EXTRACTION W/PHACO Left 04/06/2018   Procedure: CATARACT EXTRACTION PHACO AND INTRAOCULAR LENS PLACEMENT (IOC) LEFT COMPLICATED;  Surgeon: Lockie Mola, MD;  Location: [facility];  Service: Ophthalmology;  Laterality: Left;  HEALON 5 VISION BLUE   CATARACT EXTRACTION W/PHACO Right 06/30/2018   Procedure: CATARACT EXTRACTION PHACO AND INTRAOCULAR LENS PLACEMENT (IOC) COMPLICATED   (MALYUGIN)  RIGHT;  Surgeon: Lockie Mola, MD;  Location: [facility];  Service: Ophthalmology;  Laterality: Right;   COLONOSCOPY     EYE SURGERY     THYROIDECTOMY  08/2017   [facility] [place]    Social History   Socioeconomic History   Marital status: Married    Spouse name: Not on file   Number of children: Not on file   Years of education: Not on file   Highest education level: Not on file  Occupational History   Not on file  Tobacco Use   Smoking status: Never   Smokeless tobacco: Never  Vaping Use   Vaping status: Never Used  Substance and Sexual Activity   Alcohol use: No    Alcohol/week: 0.0 standard drinks of alcohol   Drug use: No   Sexual activity: Not on file  Other  Topics Concern   Not on file  Social History Narrative   Not on file   Social Drivers of Health   Financial Resource Strain: Low Risk  (11/11/2023)   Overall Financial Resource Strain (CARDIA)    Difficulty of Paying Living Expenses: Not hard at all  Food Insecurity: No Food Insecurity (11/11/2023)   Hunger Vital Sign    Worried About Running Out of Food in the Last Year: Never true    Ran Out of Food in the Last Year: Never true  Transportation Needs: No Transportation Needs (11/11/2023)   PRAPARE - Administrator, Civil Service (Medical): No    Lack of Transportation (Non-Medical): No  Physical Activity: Insufficiently Active (11/11/2023)   Exercise Vital Sign    Days of Exercise per Week: 3 days    Minutes of Exercise per Session: 20 min  Stress: Not on file  Social Connections: Not on file  Intimate Partner Violence: Not At Risk (11/11/2023)   Humiliation, Afraid, Rape, and Kick questionnaire    Fear of Current or Ex-Partner: No    Emotionally Abused: No    Physically Abused: No    Sexually Abused: No    Family History  Problem Relation Age of Onset   Breast cancer Cousin  Breast cancer Mother        24's   Breast cancer Maternal Aunt        60's    Allergies  Allergen Reactions   Niacin Rash    Hives    Outpatient Medications Prior to Visit  Medication Sig   acetaminophen (TYLENOL) 500 MG tablet Take 500 mg by mouth every 6 (six) hours as needed.   atorvastatin (LIPITOR) 20 MG tablet TAKE 1 TABLET BY MOUTH EVERY DAY   bimatoprost (LUMIGAN) 0.03 % ophthalmic solution Place 1 drop into both eyes at bedtime.   brimonidine-timolol (COMBIGAN) 0.2-0.5 % ophthalmic solution Place 1 drop into both eyes every 12 (twelve) hours.   brinzolamide (AZOPT) 1 % ophthalmic suspension Place 1 drop into both eyes 2 (two) times daily.   Cholecalciferol (VITAMIN D3) 1.25 MG (50000 UT) CAPS TAKE 1 CAPSULE BY MOUTH ONCE WEEKLY   furosemide (LASIX) 20 MG tablet Take 1  tablet (20 mg total) by mouth as needed for edema.   levothyroxine (SYNTHROID) 75 MCG tablet Take 75 mcg by mouth daily before breakfast.   lisinopril (ZESTRIL) 5 MG tablet TAKE 1 TABLET BY MOUTH EVERY DAY   meloxicam (MOBIC) 15 MG tablet TAKE 1 TABLET BY MOUTH ONCE DAILY WITH FOOD. (Patient not taking: Reported on 01/12/2024)   metoprolol succinate (TOPROL-XL) 25 MG 24 hr tablet TAKE 1/2 TABLET BY MOUTH DAILY   spironolactone (ALDACTONE) 25 MG tablet TAKE 1/2 TABLET BY MOUTH DAILY   No facility-administered medications prior to visit.    ROS     Objective:   There were no vitals taken for this visit.  There were no vitals filed for this visit.  Physical Exam   No results found for any visits on 01/23/24.  Recent Results (from the past 2160 hours)  POCT XPERT XPRESS SARS COVID-2/FLU/RSV [[id number]]     Status: Normal   Collection Time: 11/11/23 10:28 AM  Result Value Ref Range   SARS Coronavirus 2 negative    FLU A negative    FLU B negative    RSV RNA, PCR negative   Lipid Panel w/o Chol/HDL Ratio     Status: None   Collection Time: 01/12/24 10:23 AM  Result Value Ref Range   Cholesterol, Total 164 100 - 199 mg/dL   Triglycerides 78 0 - 149 mg/dL   HDL 59 >40 mg/dL   VLDL Cholesterol Cal 15 5 - 40 mg/dL   LDL Chol Calc (NIH) 90 0 - 99 mg/dL  [id number]     Status: Abnormal   Collection Time: 01/12/24 10:23 AM  Result Value Ref Range   Glucose 92 70 - 99 mg/dL   BUN 8 8 - 27 mg/dL   Creatinine, Ser 2.95 0.57 - 1.00 mg/dL   eGFR 84 >62 ZH/YQM/5.78   BUN/Creatinine Ratio 11 (L) 12 - 28   Sodium 145 (H) 134 - 144 mmol/L   Potassium 4.3 3.5 - 5.2 mmol/L   Chloride 105 96 - 106 mmol/L   CO2 26 20 - 29 mmol/L   Calcium 9.2 8.7 - 10.3 mg/dL   Total Protein 6.0 6.0 - 8.5 g/dL   Albumin 3.9 3.8 - 4.8 g/dL   Globulin, Total 2.1 1.5 - 4.5 g/dL   Bilirubin Total 0.6 0.0 - 1.2 mg/dL   Alkaline Phosphatase 146 (H) 44 - 121 IU/L   AST 14 0 - 40 IU/L   ALT 9 0 - 32 IU/L   Vitamin B12     Status: Abnormal  Collection Time: 01/12/24 10:23 AM  Result Value Ref Range   Vitamin B-12 228 (L) 232 - 1,245 pg/mL  Vitamin D (25 hydroxy)     Status: None   Collection Time: 01/12/24 10:23 AM  Result Value Ref Range   Vit D, 25-Hydroxy 100.0 30.0 - 100.0 ng/mL    Comment: Vitamin D deficiency has been defined by the Institute of Medicine and an Endocrine Society practice guideline as a level of serum 25-OH vitamin D less than 20 ng/mL (1,2). The Endocrine Society went on to further define vitamin D insufficiency as a level between 21 and 29 ng/mL (2). 1. IOM (Institute of Medicine). 2010. Dietary reference    intakes for calcium and D. [place]: The    [employer]. 2. Holick MF, Binkley Blanca, Bischoff-Ferrari HA, et al.    Evaluation, treatment, and prevention of vitamin D    deficiency: an Endocrine Society clinical practice    guideline. JCEM. 2011 Jul; 96(7):1911-30.       Assessment & Plan:  Vit b12 inj. Problem List Items Addressed This Visit     Vitamin B12 deficiency - Primary    No follow-ups on file.   Total time spent: 10 minutes  Margaretann Loveless, MD  01/23/2024   This document may have been prepared by [employer] Voice Recognition software and as such may include unintentional dictation errors.

## 2024-01-26 DIAGNOSIS — M25551 Pain in right hip: Secondary | ICD-10-CM | POA: Diagnosis not present

## 2024-01-26 DIAGNOSIS — M25651 Stiffness of right hip, not elsewhere classified: Secondary | ICD-10-CM | POA: Diagnosis not present

## 2024-01-30 ENCOUNTER — Encounter: Payer: Self-pay | Admitting: Internal Medicine

## 2024-01-30 ENCOUNTER — Ambulatory Visit (INDEPENDENT_AMBULATORY_CARE_PROVIDER_SITE_OTHER): Payer: Medicare Other | Admitting: Internal Medicine

## 2024-01-30 DIAGNOSIS — E538 Deficiency of other specified B group vitamins: Secondary | ICD-10-CM | POA: Diagnosis not present

## 2024-01-30 MED ORDER — CYANOCOBALAMIN 1000 MCG/ML IJ SOLN
1000.0000 ug | Freq: Once | INTRAMUSCULAR | Status: AC
  Administered 2024-01-30: 1000 ug via INTRAMUSCULAR

## 2024-01-30 NOTE — Progress Notes (Signed)
Vit B 12 inj.

## 2024-02-02 DIAGNOSIS — M25651 Stiffness of right hip, not elsewhere classified: Secondary | ICD-10-CM | POA: Diagnosis not present

## 2024-02-02 DIAGNOSIS — M25551 Pain in right hip: Secondary | ICD-10-CM | POA: Diagnosis not present

## 2024-02-04 DIAGNOSIS — M25651 Stiffness of right hip, not elsewhere classified: Secondary | ICD-10-CM | POA: Diagnosis not present

## 2024-02-04 DIAGNOSIS — M25551 Pain in right hip: Secondary | ICD-10-CM | POA: Diagnosis not present

## 2024-02-05 DIAGNOSIS — H04123 Dry eye syndrome of bilateral lacrimal glands: Secondary | ICD-10-CM | POA: Diagnosis not present

## 2024-02-05 DIAGNOSIS — Z961 Presence of intraocular lens: Secondary | ICD-10-CM | POA: Diagnosis not present

## 2024-02-05 DIAGNOSIS — H35031 Hypertensive retinopathy, right eye: Secondary | ICD-10-CM | POA: Diagnosis not present

## 2024-02-05 DIAGNOSIS — H40003 Preglaucoma, unspecified, bilateral: Secondary | ICD-10-CM | POA: Diagnosis not present

## 2024-02-06 ENCOUNTER — Ambulatory Visit (INDEPENDENT_AMBULATORY_CARE_PROVIDER_SITE_OTHER): Payer: Medicare Other | Admitting: Internal Medicine

## 2024-02-06 ENCOUNTER — Encounter: Payer: Self-pay | Admitting: Internal Medicine

## 2024-02-06 DIAGNOSIS — E538 Deficiency of other specified B group vitamins: Secondary | ICD-10-CM | POA: Diagnosis not present

## 2024-02-06 MED ORDER — CYANOCOBALAMIN 1000 MCG/ML IJ SOLN
1000.0000 ug | Freq: Once | INTRAMUSCULAR | Status: AC
  Administered 2024-02-06: 1000 ug via INTRAMUSCULAR

## 2024-02-06 NOTE — Progress Notes (Signed)
 Vit B12 inj only-

## 2024-02-13 ENCOUNTER — Ambulatory Visit: Payer: Medicare Other | Admitting: Family

## 2024-02-13 DIAGNOSIS — Z23 Encounter for immunization: Secondary | ICD-10-CM | POA: Diagnosis not present

## 2024-02-13 DIAGNOSIS — E538 Deficiency of other specified B group vitamins: Secondary | ICD-10-CM

## 2024-02-13 MED ORDER — CYANOCOBALAMIN 1000 MCG/ML IJ SOLN
1000.0000 ug | Freq: Once | INTRAMUSCULAR | Status: AC
  Administered 2024-02-13: 1000 ug via INTRAMUSCULAR

## 2024-02-20 ENCOUNTER — Ambulatory Visit: Payer: Medicare Other

## 2024-02-23 ENCOUNTER — Ambulatory Visit (INDEPENDENT_AMBULATORY_CARE_PROVIDER_SITE_OTHER): Payer: Medicare Other | Admitting: Family

## 2024-02-23 DIAGNOSIS — E538 Deficiency of other specified B group vitamins: Secondary | ICD-10-CM | POA: Diagnosis not present

## 2024-02-23 MED ORDER — CYANOCOBALAMIN 1000 MCG/ML IJ SOLN
1000.0000 ug | Freq: Once | INTRAMUSCULAR | Status: AC
  Administered 2024-02-23: 1000 ug via INTRAMUSCULAR

## 2024-03-07 ENCOUNTER — Encounter: Payer: Self-pay | Admitting: Family

## 2024-03-07 NOTE — Progress Notes (Signed)
   CHIEF COMPLAINT  B12 Shot     REASON FOR VISIT  B12 Injection     ASSESSMENT  B12 Deficiency, Unspecified     PLAN  Diagnoses and all orders for this visit:  Vitamin B12 deficiency -     cyanocobalamin (VITAMIN B12) injection 1,000 mcg     Pt. given B12 injection in clinic.  Return for next injection per provider instructions.   Total time spent: 5 minutes  Miki Kins, FNP  02/23/2024

## 2024-04-05 DIAGNOSIS — H02052 Trichiasis without entropian right lower eyelid: Secondary | ICD-10-CM | POA: Diagnosis not present

## 2024-04-05 DIAGNOSIS — E0500 Thyrotoxicosis with diffuse goiter without thyrotoxic crisis or storm: Secondary | ICD-10-CM | POA: Diagnosis not present

## 2024-04-05 DIAGNOSIS — H40003 Preglaucoma, unspecified, bilateral: Secondary | ICD-10-CM | POA: Diagnosis not present

## 2024-04-05 DIAGNOSIS — H02055 Trichiasis without entropian left lower eyelid: Secondary | ICD-10-CM | POA: Diagnosis not present

## 2024-04-24 ENCOUNTER — Encounter: Payer: Self-pay | Admitting: Family

## 2024-04-24 NOTE — Progress Notes (Signed)
   CHIEF COMPLAINT  B12 Shot/Flu Vaccine     REASON FOR VISIT  B12 Injection/Flu Vaccine     ASSESSMENT  B12 Deficiency, Unspecified Need for Flu Vaccine     PLAN  Diagnoses and all orders for this visit:  Needs flu shot Comments: Flu vaccine given in office today.  Patient advisedi to utilize supportive measures to manage any side effects. Orders: -     Flu Vaccine Trivalent High Dose (Fluad)  Vitamin B12 deficiency -     cyanocobalamin  (VITAMIN B12) injection 1,000 mcg     Total time spent: 5 minutes  Trenda Frisk, FNP  02/13/2024

## 2024-04-24 NOTE — Assessment & Plan Note (Signed)
 B12 injection given in office today.  Return for next injection per provider instructions.

## 2024-04-27 DIAGNOSIS — Z96641 Presence of right artificial hip joint: Secondary | ICD-10-CM | POA: Diagnosis not present

## 2024-04-27 DIAGNOSIS — M25551 Pain in right hip: Secondary | ICD-10-CM | POA: Diagnosis not present

## 2024-05-09 ENCOUNTER — Other Ambulatory Visit: Payer: Self-pay | Admitting: Cardiovascular Disease

## 2024-05-09 DIAGNOSIS — I351 Nonrheumatic aortic (valve) insufficiency: Secondary | ICD-10-CM

## 2024-05-09 DIAGNOSIS — R609 Edema, unspecified: Secondary | ICD-10-CM

## 2024-05-10 DIAGNOSIS — H40003 Preglaucoma, unspecified, bilateral: Secondary | ICD-10-CM | POA: Diagnosis not present

## 2024-05-10 DIAGNOSIS — H168 Other keratitis: Secondary | ICD-10-CM | POA: Diagnosis not present

## 2024-05-10 DIAGNOSIS — Z961 Presence of intraocular lens: Secondary | ICD-10-CM | POA: Diagnosis not present

## 2024-05-10 DIAGNOSIS — E0500 Thyrotoxicosis with diffuse goiter without thyrotoxic crisis or storm: Secondary | ICD-10-CM | POA: Diagnosis not present

## 2024-05-13 ENCOUNTER — Ambulatory Visit: Payer: Medicare Other | Admitting: Internal Medicine

## 2024-05-13 ENCOUNTER — Encounter: Payer: Self-pay | Admitting: Internal Medicine

## 2024-05-13 VITALS — BP 106/66 | HR 77 | Ht 63.0 in | Wt 195.2 lb

## 2024-05-13 DIAGNOSIS — E0500 Thyrotoxicosis with diffuse goiter without thyrotoxic crisis or storm: Secondary | ICD-10-CM

## 2024-05-13 DIAGNOSIS — I10 Essential (primary) hypertension: Secondary | ICD-10-CM

## 2024-05-13 DIAGNOSIS — E538 Deficiency of other specified B group vitamins: Secondary | ICD-10-CM

## 2024-05-13 DIAGNOSIS — E782 Mixed hyperlipidemia: Secondary | ICD-10-CM

## 2024-05-13 MED ORDER — VITAMIN B-12 1000 MCG PO TABS: 1000.0000 ug | ORAL_TABLET | Freq: Every day | ORAL | 3 refills | Status: AC

## 2024-05-13 NOTE — Progress Notes (Signed)
 Established Patient Office Visit  Subjective:  Patient ID: Cindy [name], female    DOB: 09-29-1946  Age: 78 y.o. MRN: [id number]  Chief Complaint  Patient presents with   Follow-up    4 month follow up    Patient comes in for her follow-up today.  She is feeling well and has no new complaints.  Taking all her medications as prescribed.  Will check her labs including vitamin B12.  She has been getting injections, will also start oral vitamin B12.    No other concerns at this time.   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  Radiation tx   BREAST SURGERY Right 04/20/2012   lumpectomy   CATARACT EXTRACTION W/PHACO Left 04/06/2018   Procedure: CATARACT EXTRACTION PHACO AND INTRAOCULAR LENS PLACEMENT (IOC) LEFT COMPLICATED;  Surgeon: Annell Kidney, MD;  Location: [facility];  Service: Ophthalmology;  Laterality: Left;  HEALON 5 VISION BLUE   CATARACT EXTRACTION W/PHACO Right 06/30/2018   Procedure: CATARACT EXTRACTION PHACO AND INTRAOCULAR LENS PLACEMENT (IOC) COMPLICATED   (MALYUGIN)  RIGHT;  Surgeon: Annell Kidney, MD;  Location: [facility];  Service: Ophthalmology;  Laterality: Right;   COLONOSCOPY     EYE SURGERY     THYROIDECTOMY  08/2017   [facility] [place]    Social History   Socioeconomic History   Marital status: Married    Spouse name: Not on file   Number of children: Not on file   Years of education: Not on file   Highest education level: Not on file  Occupational History   Not on file   Tobacco Use   Smoking status: Never   Smokeless tobacco: Never  Vaping Use   Vaping status: Never Used  Substance and Sexual Activity   Alcohol use: No    Alcohol/week: 0.0 standard drinks of alcohol   Drug use: No   Sexual activity: Not on file  Other Topics Concern   Not on file  Social History Narrative   Not on file   Social Drivers of Health   Financial Resource Strain: Low Risk  (11/11/2023)   Overall Financial Resource Strain (CARDIA)    Difficulty of Paying Living Expenses: Not hard at all  Food Insecurity: No Food Insecurity (11/11/2023)   Hunger Vital Sign    Worried About Running Out of Food in the Last Year: Never true    Ran Out of Food in the Last Year: Never true  Transportation Needs: No Transportation Needs (11/11/2023)   PRAPARE - Administrator, Civil Service (Medical): No    Lack of Transportation (Non-Medical): No  Physical Activity: Insufficiently Active (11/11/2023)   Exercise Vital Sign    Days of Exercise per Week: 3 days    Minutes of Exercise per Session: 20 min  Stress: Not on file  Social Connections: Not on file  Intimate Partner Violence: Not At Risk (11/11/2023)   Humiliation, Afraid,  Rape, and Kick questionnaire    Fear of Current or Ex-Partner: No    Emotionally Abused: No    Physically Abused: No    Sexually Abused: No    Family History  Problem Relation Age of Onset   Breast cancer Cousin    Breast cancer Mother        66's   Breast cancer Maternal Aunt        60's    Allergies  Allergen Reactions   Niacin Rash    Hives    Outpatient Medications Prior to Visit  Medication Sig   acetaminophen (TYLENOL) 500 MG tablet Take 500 mg by mouth every 6 (six) hours as needed.   atorvastatin (LIPITOR) 20 MG tablet TAKE 1 TABLET BY MOUTH EVERY DAY   bimatoprost (LUMIGAN) 0.03 % ophthalmic solution Place 1 drop into both eyes at bedtime.   brimonidine -timolol  (COMBIGAN ) 0.2-0.5 % ophthalmic solution Place 1 drop into  both eyes every 12 (twelve) hours.   brinzolamide (AZOPT) 1 % ophthalmic suspension Place 1 drop into both eyes 2 (two) times daily.   celecoxib (CELEBREX) 200 MG capsule Take 200 mg by mouth daily.   Cholecalciferol (VITAMIN D3) 1.25 MG (50000 UT) CAPS TAKE 1 CAPSULE BY MOUTH ONCE WEEKLY   levothyroxine (SYNTHROID) 75 MCG tablet Take 75 mcg by mouth daily before breakfast.   lisinopril (ZESTRIL) 5 MG tablet TAKE 1 TABLET BY MOUTH EVERY DAY   metoprolol succinate (TOPROL-XL) 25 MG 24 hr tablet TAKE 1/2 TABLET BY MOUTH DAILY   spironolactone (ALDACTONE) 25 MG tablet TAKE 1/2 TABLET BY MOUTH DAILY   furosemide  (LASIX ) 20 MG tablet Take 1 tablet (20 mg total) by mouth as needed for edema. (Patient not taking: Reported on 05/13/2024)   meloxicam (MOBIC) 15 MG tablet TAKE 1 TABLET BY MOUTH ONCE DAILY WITH FOOD. (Patient not taking: Reported on 05/13/2024)   No facility-administered medications prior to visit.    Review of Systems  Constitutional: Negative.  Negative for chills, fever, malaise/fatigue and weight loss.  HENT: Negative.  Negative for hearing loss and sore throat.   Eyes: Negative.   Respiratory: Negative.  Negative for cough and shortness of breath.   Cardiovascular: Negative.  Negative for chest pain, palpitations and leg swelling.  Gastrointestinal: Negative.  Negative for abdominal pain, constipation, diarrhea, heartburn, nausea and vomiting.  Genitourinary: Negative.  Negative for dysuria and flank pain.  Musculoskeletal: Negative.  Negative for joint pain and myalgias.  Neurological: Negative.  Negative for dizziness, tingling, tremors and headaches.  Endo/Heme/Allergies: Negative.   Psychiatric/Behavioral: Negative.  Negative for depression and suicidal ideas. The patient is not nervous/anxious.        Objective:   BP 106/66   Pulse 77   Ht 5\' 3"  (1.6 m)   Wt 195 lb 3.2 oz (88.5 kg)   SpO2 98%   BMI 34.58 kg/m   Vitals:   05/13/24 0955  BP: 106/66  Pulse: 77   Height: 5\' 3"  (1.6 m)  Weight: 195 lb 3.2 oz (88.5 kg)  SpO2: 98%  BMI (Calculated): 34.59    Physical Exam Vitals and nursing note reviewed.  Constitutional:      Appearance: Normal appearance.  HENT:     Head: Normocephalic and atraumatic.     Nose: Nose normal.     Mouth/Throat:     Mouth: Mucous membranes are moist.     Pharynx: Oropharynx is clear.  Eyes:     Conjunctiva/sclera: Conjunctivae normal.     Pupils:  Pupils are equal, round, and reactive to light.  Cardiovascular:     Rate and Rhythm: Normal rate and regular rhythm.     Pulses: Normal pulses.     Heart sounds: Normal heart sounds. No murmur heard. Pulmonary:     Effort: Pulmonary effort is normal.     Breath sounds: Normal breath sounds. No wheezing.  Abdominal:     General: Bowel sounds are normal.     Palpations: Abdomen is soft.     Tenderness: There is no abdominal tenderness. There is no right CVA tenderness or left CVA tenderness.  Musculoskeletal:        General: Normal range of motion.     Cervical back: Normal range of motion.     Right lower leg: No edema.     Left lower leg: No edema.  Skin:    General: Skin is warm and dry.  Neurological:     General: No focal deficit present.     Mental Status: She is alert and oriented to person, place, and time.  Psychiatric:        Mood and Affect: Mood normal.        Behavior: Behavior normal.      No results found for any visits on 05/13/24.  No results found for this or any previous visit (from the past 2160 hours).    Assessment & Plan:  Continue all medications.  Check blood work today. Problem List Items Addressed This Visit     Graves' ophthalmopathy   Relevant Orders   CBC with Diff   TSH+T4F+T3Free   Vitamin B12 deficiency   Relevant Medications   cyanocobalamin  (VITAMIN B12) 1000 MCG tablet   Mixed hyperlipidemia   Relevant Orders   Lipid Panel w/o Chol/HDL Ratio   Essential hypertension, benign - Primary   Relevant Orders    CMP14+EGFR    Return in about 3 months (around 08/13/2024).   Total time spent: 30 minutes  Aisha Hove, MD  05/13/2024   This document may have been prepared by [facility] Voice Recognition software and as such may include unintentional dictation errors.

## 2024-05-14 ENCOUNTER — Ambulatory Visit: Payer: Self-pay | Admitting: Internal Medicine

## 2024-05-14 LAB — CMP14+EGFR
ALT: 9 IU/L (ref 0–32)
AST: 11 IU/L (ref 0–40)
Albumin: 4 g/dL (ref 3.8–4.8)
Alkaline Phosphatase: 124 IU/L — ABNORMAL HIGH (ref 44–121)
BUN/Creatinine Ratio: 13 (ref 12–28)
BUN: 13 mg/dL (ref 8–27)
Bilirubin Total: 1.2 mg/dL (ref 0.0–1.2)
CO2: 24 mmol/L (ref 20–29)
Calcium: 9.4 mg/dL (ref 8.7–10.3)
Chloride: 105 mmol/L (ref 96–106)
Creatinine, Ser: 0.98 mg/dL (ref 0.57–1.00)
Globulin, Total: 1.7 g/dL (ref 1.5–4.5)
Glucose: 95 mg/dL (ref 70–99)
Potassium: 4.2 mmol/L (ref 3.5–5.2)
Sodium: 143 mmol/L (ref 134–144)
Total Protein: 5.7 g/dL — ABNORMAL LOW (ref 6.0–8.5)
eGFR: 60 mL/min/{1.73_m2} (ref >59)

## 2024-05-14 LAB — CBC WITH DIFFERENTIAL/PLATELET
Basophils Absolute: 0 10*3/uL (ref 0.0–0.2)
Basos: 1 %
EOS (ABSOLUTE): 0.2 10*3/uL (ref 0.0–0.4)
Eos: 2 %
Hematocrit: 40.7 % (ref 34.0–46.6)
Hemoglobin: 13.3 g/dL (ref 11.1–15.9)
Immature Grans (Abs): 0 10*3/uL (ref 0.0–0.1)
Immature Granulocytes: 0 %
Lymphocytes Absolute: 1.6 10*3/uL (ref 0.7–3.1)
Lymphs: 21 %
MCH: 31.1 pg (ref 26.6–33.0)
MCHC: 32.7 g/dL (ref 31.5–35.7)
MCV: 95 fL (ref 79–97)
Monocytes Absolute: 0.5 10*3/uL (ref 0.1–0.9)
Monocytes: 7 %
Neutrophils Absolute: 5 10*3/uL (ref 1.4–7.0)
Neutrophils: 69 %
Platelets: 228 10*3/uL (ref 150–450)
RBC: 4.28 x10E6/uL (ref 3.77–5.28)
RDW: 12.9 % (ref 11.7–15.4)
WBC: 7.3 10*3/uL (ref 3.4–10.8)

## 2024-05-14 LAB — LIPID PANEL W/O CHOL/HDL RATIO
Cholesterol, Total: 159 mg/dL (ref 100–199)
HDL: 59 mg/dL (ref >39)
LDL Chol Calc (NIH): 85 mg/dL (ref 0–99)
Triglycerides: 80 mg/dL (ref 0–149)
VLDL Cholesterol Cal: 15 mg/dL (ref 5–40)

## 2024-05-14 LAB — TSH+T4F+T3FREE
Free T4: 1.26 ng/dL (ref 0.82–1.77)
T3, Free: 2.3 pg/mL (ref 2.0–4.4)
TSH: 1.3 u[IU]/mL (ref 0.450–4.500)

## 2024-07-03 ENCOUNTER — Other Ambulatory Visit: Payer: Self-pay | Admitting: Cardiovascular Disease

## 2024-07-15 NOTE — Progress Notes (Signed)
   07/15/2024  Patient ID: Cindy [name], female   DOB: 1946-05-20, 78 y.o.   MRN: [id number]  Pharmacy Quality Measure Review  This patient is appearing on a report for being at risk of failing the adherence measure for hypertension (ACEi/ARB) medications this calendar year.   Medication: Lisinopril 5mg  Last fill date: 07/05/24 for 90 day supply  Insurance report was not up to date. No action needed at this time.   Jon VEAR Lindau, PharmD Clinical Pharmacist [phone]

## 2024-07-19 ENCOUNTER — Other Ambulatory Visit: Payer: Self-pay | Admitting: Cardiovascular Disease

## 2024-07-19 DIAGNOSIS — I351 Nonrheumatic aortic (valve) insufficiency: Secondary | ICD-10-CM

## 2024-07-19 DIAGNOSIS — R609 Edema, unspecified: Secondary | ICD-10-CM

## 2024-08-13 ENCOUNTER — Ambulatory Visit: Admitting: Internal Medicine

## 2024-09-29 DIAGNOSIS — E039 Hypothyroidism, unspecified: Secondary | ICD-10-CM | POA: Diagnosis not present

## 2024-10-08 ENCOUNTER — Other Ambulatory Visit: Payer: Self-pay | Admitting: Internal Medicine

## 2024-10-08 DIAGNOSIS — Z1231 Encounter for screening mammogram for malignant neoplasm of breast: Secondary | ICD-10-CM

## 2024-10-30 ENCOUNTER — Other Ambulatory Visit: Payer: Self-pay | Admitting: Internal Medicine

## 2024-11-05 ENCOUNTER — Ambulatory Visit
Admission: RE | Admit: 2024-11-05 | Discharge: 2024-11-05 | Disposition: A | Discharge status: Home / Self Care | Source: Ambulatory Visit | Attending: Internal Medicine | Admitting: Internal Medicine

## 2024-11-05 DIAGNOSIS — Z1231 Encounter for screening mammogram for malignant neoplasm of breast: Secondary | ICD-10-CM | POA: Diagnosis present

## 2024-12-22 ENCOUNTER — Other Ambulatory Visit: Payer: Self-pay | Admitting: Cardiovascular Disease

## 2025-01-14 ENCOUNTER — Other Ambulatory Visit: Payer: Self-pay | Admitting: Cardiovascular Disease

## 2025-01-14 DIAGNOSIS — R609 Edema, unspecified: Secondary | ICD-10-CM

## 2025-01-20 ENCOUNTER — Other Ambulatory Visit: Payer: Self-pay | Admitting: Cardiovascular Disease

## 2025-01-20 DIAGNOSIS — I351 Nonrheumatic aortic (valve) insufficiency: Secondary | ICD-10-CM

## 2025-02-03 ENCOUNTER — Other Ambulatory Visit: Payer: Self-pay | Admitting: Internal Medicine

## 2025-02-03 DIAGNOSIS — E782 Mixed hyperlipidemia: Secondary | ICD-10-CM

## 2025-02-24 ENCOUNTER — Ambulatory Visit: Admitting: Internal Medicine
